# Patient Record
Sex: Female | Born: 1985 | Hispanic: No | Marital: Married | Smoking: Never smoker
Health system: Southern US, Community
[De-identification: ages and names within clinical notes are randomized; demographics above are authoritative.]

## PROBLEM LIST (undated history)

## (undated) DIAGNOSIS — Z789 Other specified health status: Secondary | ICD-10-CM

## (undated) DIAGNOSIS — O24419 Gestational diabetes mellitus in pregnancy, unspecified control: Secondary | ICD-10-CM

## (undated) HISTORY — DX: Gestational diabetes mellitus in pregnancy, unspecified control: O24.419

## (undated) HISTORY — PX: NO PAST SURGERIES: SHX2092

## (undated) HISTORY — PX: UTERINE SEPTUM RESECTION: SHX5386

---

## 2014-07-09 LAB — OB RESULTS CONSOLE GC/CHLAMYDIA
Chlamydia: NEGATIVE
Gonorrhea: NEGATIVE

## 2014-07-09 LAB — OB RESULTS CONSOLE RPR: RPR: NONREACTIVE

## 2014-07-09 LAB — OB RESULTS CONSOLE HIV ANTIBODY (ROUTINE TESTING): HIV: NONREACTIVE

## 2014-07-09 LAB — OB RESULTS CONSOLE ABO/RH: RH TYPE: POSITIVE

## 2014-07-09 LAB — OB RESULTS CONSOLE RUBELLA ANTIBODY, IGM: Rubella: IMMUNE

## 2014-07-09 LAB — OB RESULTS CONSOLE ANTIBODY SCREEN: Antibody Screen: NEGATIVE

## 2014-07-09 LAB — OB RESULTS CONSOLE HEPATITIS B SURFACE ANTIGEN: HEP B S AG: NEGATIVE

## 2014-10-06 ENCOUNTER — Other Ambulatory Visit (HOSPITAL_COMMUNITY): Payer: Self-pay | Admitting: Obstetrics and Gynecology

## 2014-10-06 DIAGNOSIS — Q210 Ventricular septal defect: Secondary | ICD-10-CM

## 2014-10-31 ENCOUNTER — Encounter (HOSPITAL_COMMUNITY): Payer: Self-pay

## 2014-10-31 ENCOUNTER — Ambulatory Visit (HOSPITAL_COMMUNITY)
Admission: RE | Admit: 2014-10-31 | Discharge: 2014-10-31 | Disposition: A | Discharge status: Home / Self Care | Payer: 59 | Source: Ambulatory Visit | Attending: Obstetrics and Gynecology | Admitting: Obstetrics and Gynecology

## 2014-10-31 ENCOUNTER — Other Ambulatory Visit (HOSPITAL_COMMUNITY): Payer: Self-pay | Admitting: Obstetrics and Gynecology

## 2014-10-31 ENCOUNTER — Encounter (HOSPITAL_COMMUNITY): Payer: Self-pay | Admitting: Obstetrics and Gynecology

## 2014-10-31 DIAGNOSIS — Z3A22 22 weeks gestation of pregnancy: Secondary | ICD-10-CM | POA: Diagnosis not present

## 2014-10-31 DIAGNOSIS — Q210 Ventricular septal defect: Secondary | ICD-10-CM

## 2014-10-31 DIAGNOSIS — O358XX0 Maternal care for other (suspected) fetal abnormality and damage, not applicable or unspecified: Secondary | ICD-10-CM | POA: Diagnosis not present

## 2014-10-31 HISTORY — DX: Other specified health status: Z78.9

## 2014-12-17 ENCOUNTER — Encounter: Payer: 59 | Attending: Obstetrics and Gynecology

## 2014-12-17 VITALS — Ht 61.0 in | Wt 142.6 lb

## 2014-12-17 DIAGNOSIS — R7302 Impaired glucose tolerance (oral): Secondary | ICD-10-CM | POA: Insufficient documentation

## 2014-12-17 DIAGNOSIS — Z713 Dietary counseling and surveillance: Secondary | ICD-10-CM | POA: Insufficient documentation

## 2014-12-17 DIAGNOSIS — R7309 Other abnormal glucose: Secondary | ICD-10-CM

## 2014-12-17 NOTE — Progress Notes (Signed)
  Patient was seen on 12/17/14 for Gestational Diabetes self-management class at the [facility]. The following learning objectives were met by the patient during this course:   States the definition of Gestational Diabetes  States why dietary management is important in controlling blood glucose  Describes the effects each nutrient has on blood glucose levels  Demonstrates ability to create a balanced meal plan  Demonstrates carbohydrate counting   States when to check blood glucose levels  Demonstrates proper blood glucose monitoring techniques  States the effect of stress and exercise on blood glucose levels  States the importance of limiting caffeine and abstaining from alcohol and smoking  Blood glucose monitor given: Accu chek [employer]iva BG Kit Lot # [id number] Exp: 07/15/15  Blood glucose reading: 131 mg/dl  Patient instructed to monitor glucose levels: FBS: 60 - <90 1 hour: <140  *Patient received handouts:  Nutrition Diabetes and Pregnancy  Carbohydrate Counting List  Patient will be seen for follow-up as needed.

## 2014-12-24 ENCOUNTER — Ambulatory Visit: Payer: 59

## 2015-01-25 ENCOUNTER — Inpatient Hospital Stay (HOSPITAL_COMMUNITY)
Admission: RE | Admit: 2015-01-25 | Discharge: 2015-01-29 | DRG: 766 | Disposition: A | Discharge status: Home / Self Care | Payer: 59 | Source: Ambulatory Visit | Attending: Obstetrics and Gynecology | Admitting: Obstetrics and Gynecology

## 2015-01-25 ENCOUNTER — Inpatient Hospital Stay (HOSPITAL_COMMUNITY): Payer: 59

## 2015-01-25 ENCOUNTER — Encounter (HOSPITAL_COMMUNITY): Payer: Self-pay | Admitting: *Deleted

## 2015-01-25 DIAGNOSIS — O34593 Maternal care for other abnormalities of gravid uterus, third trimester: Secondary | ICD-10-CM | POA: Diagnosis present

## 2015-01-25 DIAGNOSIS — Z98891 History of uterine scar from previous surgery: Secondary | ICD-10-CM

## 2015-01-25 DIAGNOSIS — O42913 Preterm premature rupture of membranes, unspecified as to length of time between rupture and onset of labor, third trimester: Principal | ICD-10-CM | POA: Diagnosis present

## 2015-01-25 DIAGNOSIS — O34211 Maternal care for low transverse scar from previous cesarean delivery: Secondary | ICD-10-CM | POA: Diagnosis present

## 2015-01-25 DIAGNOSIS — O42013 Preterm premature rupture of membranes, onset of labor within 24 hours of rupture, third trimester: Secondary | ICD-10-CM

## 2015-01-25 DIAGNOSIS — O321XX0 Maternal care for breech presentation, not applicable or unspecified: Secondary | ICD-10-CM | POA: Diagnosis present

## 2015-01-25 DIAGNOSIS — O24419 Gestational diabetes mellitus in pregnancy, unspecified control: Secondary | ICD-10-CM

## 2015-01-25 DIAGNOSIS — Q513 Bicornate uterus: Secondary | ICD-10-CM

## 2015-01-25 DIAGNOSIS — O42919 Preterm premature rupture of membranes, unspecified as to length of time between rupture and onset of labor, unspecified trimester: Secondary | ICD-10-CM

## 2015-01-25 DIAGNOSIS — Z3A35 35 weeks gestation of pregnancy: Secondary | ICD-10-CM

## 2015-01-25 DIAGNOSIS — D649 Anemia, unspecified: Secondary | ICD-10-CM | POA: Diagnosis present

## 2015-01-25 DIAGNOSIS — O9902 Anemia complicating childbirth: Secondary | ICD-10-CM | POA: Diagnosis present

## 2015-01-25 DIAGNOSIS — O24415 Gestational diabetes mellitus in pregnancy, controlled by oral hypoglycemic drugs: Secondary | ICD-10-CM | POA: Diagnosis present

## 2015-01-25 LAB — CBC
HCT: 33.8 % — ABNORMAL LOW (ref 36.0–46.0)
Hemoglobin: 11.7 g/dL — ABNORMAL LOW (ref 12.0–15.0)
MCH: 30.2 pg (ref 26.0–34.0)
MCHC: 34.6 g/dL (ref 30.0–36.0)
MCV: 87.3 fL (ref 78.0–100.0)
Platelets: 177 10*3/uL (ref 150–400)
RBC: 3.87 MIL/uL (ref 3.87–5.11)
RDW: 14.6 % (ref 11.5–15.5)
WBC: 14.7 10*3/uL — AB (ref 4.0–10.5)

## 2015-01-25 LAB — POCT FERN TEST: POCT Fern Test: POSITIVE

## 2015-01-25 MED ORDER — FAMOTIDINE IN NACL 20-0.9 MG/50ML-% IV SOLN
20.0000 mg | Freq: Once | INTRAVENOUS | Status: AC
  Administered 2015-01-26: 20 mg via INTRAVENOUS
  Filled 2015-01-25: qty 50

## 2015-01-25 MED ORDER — LACTATED RINGERS IV SOLN
INTRAVENOUS | Status: DC
  Administered 2015-01-26 (×2): via INTRAVENOUS

## 2015-01-25 MED ORDER — FENTANYL CITRATE (PF) 100 MCG/2ML IJ SOLN
INTRAMUSCULAR | Status: AC
  Filled 2015-01-25: qty 2

## 2015-01-25 MED ORDER — LACTATED RINGERS IV BOLUS (SEPSIS)
1000.0000 mL | Freq: Once | INTRAVENOUS | Status: AC
  Administered 2015-01-25: 1000 mL via INTRAVENOUS

## 2015-01-25 MED ORDER — CITRIC ACID-SODIUM CITRATE 334-500 MG/5ML PO SOLN
30.0000 mL | Freq: Once | ORAL | Status: AC
  Administered 2015-01-26: 30 mL via ORAL
  Filled 2015-01-25: qty 15

## 2015-01-25 MED ORDER — MORPHINE SULFATE (PF) 0.5 MG/ML IJ SOLN
INTRAMUSCULAR | Status: AC
  Filled 2015-01-25: qty 10

## 2015-01-25 NOTE — H&P (Signed)
Brandy Osborne is a 29 y.o. female, G1P0 at 35.1 weeks, presented to MAU for Premature Preterm rupture of membranes and Breech presentation.  Patient Active Problem List   Diagnosis Date Noted  . 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 Gestational diabetes mellitus (GDM) 01/25/2015  . Preterm premature rupture of membranes 01/25/2015    History of present pregnancy: Patient entered care at 10.2 weeks.   EDC of 02/28/2015 was established by LMP.   Anatomy scan:  22 weeks, with normal findings and an anterior placenta.   Transverse presentation. May be a small uterine septum at fundus Additional [place] evaluations:   26.2 (11/24/14) F/u US: Breech presentation, anterior, fluid is normal with vertical pocket=  6.6 cm. Profile seen. Anatomy complete. Marginal cord not seen on today's [place].  32.3 wks (12/24/14) F/u US: Vertex presentation, anterior placenta, AFI normal 20.3 cm,  Cervix normal , no previa,   EFW 1683g 63%. Placental cord insertion seen,  measures 2.8cm, from placental edge  34.3 wks  (01/20/15) F/U US : Breech presentation, anterior placenta, fluid is normal t 80th% , EFW 2485g  65Th% Significant prenatal events:   Frequent fetal position changes from transverse, to vertex to breech.  Bicornate uterus noted on [place]    Last evaluation:  01/20/15 S. Rivard, MD   Breech  158 bpm,  90/60   142 lbs 2wk ROB after growth U/S: U/S: Singleton pregnancy. Breech presentation. Anterior placenta. Fluid is normal-AFI=80th% 21.81 cm. Cervix not seen per protocol. EFW 5 lb 8 oz, 65th% Breech reviewed: patient is not a candidate for ECV due to bicornuate Superficial varicosities of legs: compression stockings and elevation discussed Pt states when she sits for long, states she gets numb from bottom down to foot. Flu/TDap UTD. JNR,CMA GDM on Glyburide 2.5 mg at dinner: all CBGs are normal.    OB History    Gravida Para Term Preterm AB TAB SAB Ectopic Multiple Living   1              Past Medical History    Diagnosis Date  . Medical history non-contributory   . GDM (gestational diabetes mellitus)    Past Surgical History  Procedure Laterality Date  . No past surgeries     Family History: family history is not on file. Social History:  reports that she has never smoked. She does not have any smokeless tobacco history on file. She reports that she does not drink alcohol or use illicit drugs.   Prenatal Transfer Tool  Maternal Diabetes: Yes:  Diabetes Type:  Insulin/Medication controlled - glyburide Genetic Screening: Normal Maternal Ultrasounds/Referrals: Normal Fetal Ultrasounds or other Referrals:  None Maternal Substance Abuse:  No Significant Maternal Medications:  None Significant Maternal Lab Results: None  TDAP 12/10/14 Flu 11/24/14  ROS:  Gross rupture at 2150, Contractions, and +FM , No VB.   No Known Allergies  Dilation: 1 Effacement (%): 60 Exam by:: Alphonzo Severance, CNM Blood pressure 123/83, pulse 82, temperature 97.9 F (36.6 C), temperature source Oral, resp. rate 17, last menstrual period 05/24/2014.  Chest clear Heart RRR without murmur Abd gravid, NT, FH 34cm on 01/20/15 Pelvic: 1/60/high Ext: wnl   FHR: Category 1, 140 bpm,  moderate variability, +Accels, no decels UCs:  Regular every 6 min   Prenatal labs: ABO, Rh: A/Positive/-- (05/25 0000) Antibody: Negative (05/25 0000) Rubella:  !Error!   Immune RPR: Nonreactive (05/25 0000)    HBsAg: Negative (05/25 0000)  HIV: Non-reactive (05/25 0000)     GBS:  unknown Sickle cell/Hgb electrophoresis:  AA% Pap:  Wnl, 08/04/14 GC:  Negative 07/09/14 Chlamydia: Negative 07/09/14 Genetic screenings:  negative Glucola:  Abnormal GTT, Abnormal 3hr GTT Other:   Hgb 11.9 at NOB, 11.6 at 28 weeks   Assessment/Plan: IUP at 35.2 Breech presentation- verified by US  PPROM at 2150 Cat 1    Plan: Admit for Emergent Primary C/s for Breech presentation per consult with Dr. Normand Sloopillard Routine [facility] orders Pain  med/epidural prn Dr. Normand Sloopillard present   Beatrix Fettersachel StallCNM, [place] 01/26/2015, 12:21 AM

## 2015-01-25 NOTE — Anesthesia Preprocedure Evaluation (Addendum)
Anesthesia Evaluation  Patient identified by MRN, date of birth, ID band Patient awake    Reviewed: Allergy & Precautions, NPO status , Patient's Chart, lab work & pertinent test results  Airway Mallampati: II  TM Distance: >3 FB Neck ROM: Full    Dental no notable dental hx.    Pulmonary neg pulmonary ROS,    Pulmonary exam normal breath sounds clear to auscultation       Cardiovascular negative cardio ROS Normal cardiovascular exam Rhythm:Regular Rate:Normal     Neuro/Psych negative neurological ROS  negative psych ROS   GI/Hepatic negative GI ROS, Neg liver ROS,   Endo/Other  diabetes, Gestational  Renal/GU negative Renal ROS  negative genitourinary   Musculoskeletal negative musculoskeletal ROS (+)   Abdominal   Peds negative pediatric ROS (+)  Hematology negative hematology ROS (+)   Anesthesia Other Findings   Reproductive/Obstetrics (+) Pregnancy                             Anesthesia Physical Anesthesia Plan  ASA: II  Anesthesia Plan: Spinal   Post-op Pain Management:    Induction:   Airway Management Planned: Natural Airway  Additional Equipment:   Intra-op Plan:   Post-operative Plan:   Informed Consent: I have reviewed the patients History and Physical, chart, labs and discussed the procedure including the risks, benefits and alternatives for the proposed anesthesia with the patient or authorized representative who has indicated his/her understanding and acceptance.   Dental advisory given  Plan Discussed with: CRNA  Anesthesia Plan Comments:         Anesthesia Quick Evaluation  

## 2015-01-26 ENCOUNTER — Encounter (HOSPITAL_COMMUNITY): Payer: Self-pay | Admitting: *Deleted

## 2015-01-26 ENCOUNTER — Encounter (HOSPITAL_COMMUNITY)
Admission: RE | Disposition: A | Discharge status: Home / Self Care | Payer: Self-pay | Source: Ambulatory Visit | Attending: Obstetrics and Gynecology

## 2015-01-26 ENCOUNTER — Inpatient Hospital Stay (HOSPITAL_COMMUNITY): Payer: 59 | Admitting: Anesthesiology

## 2015-01-26 DIAGNOSIS — Q513 Bicornate uterus: Secondary | ICD-10-CM | POA: Diagnosis not present

## 2015-01-26 DIAGNOSIS — Z3A35 35 weeks gestation of pregnancy: Secondary | ICD-10-CM | POA: Diagnosis not present

## 2015-01-26 DIAGNOSIS — O42913 Preterm premature rupture of membranes, unspecified as to length of time between rupture and onset of labor, third trimester: Secondary | ICD-10-CM | POA: Diagnosis present

## 2015-01-26 DIAGNOSIS — D649 Anemia, unspecified: Secondary | ICD-10-CM | POA: Diagnosis present

## 2015-01-26 DIAGNOSIS — O34211 Maternal care for low transverse scar from previous cesarean delivery: Secondary | ICD-10-CM | POA: Diagnosis present

## 2015-01-26 DIAGNOSIS — O9902 Anemia complicating childbirth: Secondary | ICD-10-CM | POA: Diagnosis present

## 2015-01-26 DIAGNOSIS — Z98891 History of uterine scar from previous surgery: Secondary | ICD-10-CM

## 2015-01-26 DIAGNOSIS — O34593 Maternal care for other abnormalities of gravid uterus, third trimester: Secondary | ICD-10-CM | POA: Diagnosis present

## 2015-01-26 DIAGNOSIS — O321XX0 Maternal care for breech presentation, not applicable or unspecified: Secondary | ICD-10-CM | POA: Diagnosis present

## 2015-01-26 DIAGNOSIS — O24415 Gestational diabetes mellitus in pregnancy, controlled by oral hypoglycemic drugs: Secondary | ICD-10-CM | POA: Diagnosis present

## 2015-01-26 LAB — CBC
HEMATOCRIT: 32.5 % — AB (ref 36.0–46.0)
Hemoglobin: 11.2 g/dL — ABNORMAL LOW (ref 12.0–15.0)
MCH: 30.1 pg (ref 26.0–34.0)
MCHC: 34.5 g/dL (ref 30.0–36.0)
MCV: 87.4 fL (ref 78.0–100.0)
Platelets: 176 10*3/uL (ref 150–400)
RBC: 3.72 MIL/uL — AB (ref 3.87–5.11)
RDW: 14.6 % (ref 11.5–15.5)
WBC: 20.8 10*3/uL — AB (ref 4.0–10.5)

## 2015-01-26 LAB — GLUCOSE, CAPILLARY
Glucose-Capillary: 91 mg/dL (ref 65–99)
Glucose-Capillary: 142 mg/dL — ABNORMAL HIGH (ref 65–99)
Glucose-Capillary: 81 mg/dL (ref 65–99)
Glucose-Capillary: 88 mg/dL (ref 65–99)

## 2015-01-26 LAB — ABO/RH: ABO/RH(D): A POS

## 2015-01-26 LAB — TYPE AND SCREEN
ABO/RH(D): A POS
ANTIBODY SCREEN: NEGATIVE

## 2015-01-26 LAB — RPR: RPR: NONREACTIVE

## 2015-01-26 SURGERY — Surgical Case
Anesthesia: Spinal

## 2015-01-26 MED ORDER — MEPERIDINE HCL 25 MG/ML IJ SOLN
INTRAMUSCULAR | Status: AC
  Filled 2015-01-26: qty 1

## 2015-01-26 MED ORDER — DEXAMETHASONE SODIUM PHOSPHATE 10 MG/ML IJ SOLN
INTRAMUSCULAR | Status: AC
  Filled 2015-01-26: qty 1

## 2015-01-26 MED ORDER — DIBUCAINE 1 % RE OINT: 1.0000 "application " | TOPICAL_OINTMENT | RECTAL | Status: DC | PRN

## 2015-01-26 MED ORDER — ACETAMINOPHEN 325 MG PO TABS
650.0000 mg | ORAL_TABLET | ORAL | Status: DC | PRN
  Administered 2015-01-26 – 2015-01-27 (×2): 650 mg via ORAL
  Filled 2015-01-26 (×2): qty 2

## 2015-01-26 MED ORDER — DIPHENHYDRAMINE HCL 25 MG PO CAPS
25.0000 mg | ORAL_CAPSULE | ORAL | Status: DC | PRN
  Filled 2015-01-26: qty 1

## 2015-01-26 MED ORDER — PHENYLEPHRINE 8 MG IN D5W 100 ML (0.08MG/ML) PREMIX OPTIME
INJECTION | INTRAVENOUS | Status: AC
  Filled 2015-01-26: qty 100

## 2015-01-26 MED ORDER — SODIUM CHLORIDE 0.9 % IJ SOLN: 3.0000 mL | INTRAMUSCULAR | Status: DC | PRN

## 2015-01-26 MED ORDER — SIMETHICONE 80 MG PO CHEW
80.0000 mg | CHEWABLE_TABLET | ORAL | Status: DC | PRN
Start: 2015-01-26 — End: 2015-01-29
  Filled 2015-01-26: qty 1

## 2015-01-26 MED ORDER — FENTANYL CITRATE (PF) 100 MCG/2ML IJ SOLN: 25.0000 ug | INTRAMUSCULAR | Status: DC | PRN

## 2015-01-26 MED ORDER — LANOLIN HYDROUS EX OINT: 1.0000 "application " | TOPICAL_OINTMENT | CUTANEOUS | Status: DC | PRN

## 2015-01-26 MED ORDER — ONDANSETRON HCL 4 MG/2ML IJ SOLN: 4.0000 mg | Freq: Three times a day (TID) | INTRAMUSCULAR | Status: DC | PRN

## 2015-01-26 MED ORDER — OXYTOCIN 10 UNIT/ML IJ SOLN
INTRAMUSCULAR | Status: AC
  Filled 2015-01-26: qty 4

## 2015-01-26 MED ORDER — MENTHOL 3 MG MT LOZG: 1.0000 | LOZENGE | OROMUCOSAL | Status: DC | PRN

## 2015-01-26 MED ORDER — ONDANSETRON HCL 4 MG/2ML IJ SOLN
INTRAMUSCULAR | Status: DC | PRN
  Administered 2015-01-26: 4 mg via INTRAVENOUS

## 2015-01-26 MED ORDER — WITCH HAZEL-GLYCERIN EX PADS: 1.0000 "application " | MEDICATED_PAD | CUTANEOUS | Status: DC | PRN

## 2015-01-26 MED ORDER — SCOPOLAMINE 1 MG/3DAYS TD PT72
1.0000 | MEDICATED_PATCH | Freq: Once | TRANSDERMAL | Status: DC
  Filled 2015-01-26: qty 1

## 2015-01-26 MED ORDER — MEPERIDINE HCL 25 MG/ML IJ SOLN
INTRAMUSCULAR | Status: DC | PRN
  Administered 2015-01-26 (×2): 12.5 mg via INTRAVENOUS

## 2015-01-26 MED ORDER — OXYCODONE-ACETAMINOPHEN 5-325 MG PO TABS
2.0000 | ORAL_TABLET | ORAL | Status: DC | PRN
Start: 2015-01-26 — End: 2015-01-29

## 2015-01-26 MED ORDER — CEFAZOLIN SODIUM-DEXTROSE 2-3 GM-% IV SOLR
INTRAVENOUS | Status: AC
  Filled 2015-01-26: qty 50

## 2015-01-26 MED ORDER — LACTATED RINGERS IV SOLN: INTRAVENOUS | Status: DC

## 2015-01-26 MED ORDER — LACTATED RINGERS IV BOLUS (SEPSIS): 500.0000 mL | Freq: Once | INTRAVENOUS | Status: DC

## 2015-01-26 MED ORDER — BISACODYL 10 MG RE SUPP: 10.0000 mg | Freq: Every day | RECTAL | Status: DC | PRN

## 2015-01-26 MED ORDER — ONDANSETRON HCL 4 MG/2ML IJ SOLN
INTRAMUSCULAR | Status: AC
  Filled 2015-01-26: qty 2

## 2015-01-26 MED ORDER — OXYTOCIN 40 UNITS IN LACTATED RINGERS INFUSION - SIMPLE MED: 62.5000 mL/h | INTRAVENOUS | Status: AC

## 2015-01-26 MED ORDER — DIPHENHYDRAMINE HCL 50 MG/ML IJ SOLN: 12.5000 mg | INTRAMUSCULAR | Status: DC | PRN

## 2015-01-26 MED ORDER — NALBUPHINE HCL 10 MG/ML IJ SOLN: 5.0000 mg | Freq: Once | INTRAMUSCULAR | Status: DC | PRN

## 2015-01-26 MED ORDER — MEPERIDINE HCL 25 MG/ML IJ SOLN: 6.2500 mg | INTRAMUSCULAR | Status: DC | PRN

## 2015-01-26 MED ORDER — FENTANYL CITRATE (PF) 100 MCG/2ML IJ SOLN
INTRAMUSCULAR | Status: AC
  Filled 2015-01-26: qty 2

## 2015-01-26 MED ORDER — SENNOSIDES-DOCUSATE SODIUM 8.6-50 MG PO TABS
2.0000 | ORAL_TABLET | ORAL | Status: DC
  Administered 2015-01-26 – 2015-01-28 (×3): 2 via ORAL
  Filled 2015-01-26 (×3): qty 2

## 2015-01-26 MED ORDER — NALBUPHINE HCL 10 MG/ML IJ SOLN: 5.0000 mg | INTRAMUSCULAR | Status: DC | PRN

## 2015-01-26 MED ORDER — IBUPROFEN 600 MG PO TABS
600.0000 mg | ORAL_TABLET | Freq: Four times a day (QID) | ORAL | Status: DC
  Administered 2015-01-26 – 2015-01-29 (×13): 600 mg via ORAL
  Filled 2015-01-26 (×14): qty 1

## 2015-01-26 MED ORDER — PHENYLEPHRINE 40 MCG/ML (10ML) SYRINGE FOR IV PUSH (FOR BLOOD PRESSURE SUPPORT)
PREFILLED_SYRINGE | INTRAVENOUS | Status: AC
  Filled 2015-01-26: qty 10

## 2015-01-26 MED ORDER — DIPHENHYDRAMINE HCL 25 MG PO CAPS: 25.0000 mg | ORAL_CAPSULE | Freq: Four times a day (QID) | ORAL | Status: DC | PRN

## 2015-01-26 MED ORDER — MEASLES, MUMPS & RUBELLA VAC ~~LOC~~ INJ
0.5000 mL | INJECTION | Freq: Once | SUBCUTANEOUS | Status: DC
  Filled 2015-01-26: qty 0.5

## 2015-01-26 MED ORDER — BUPIVACAINE IN DEXTROSE 0.75-8.25 % IT SOLN
INTRATHECAL | Status: DC | PRN
  Administered 2015-01-26: 1.4 mL via INTRATHECAL

## 2015-01-26 MED ORDER — MORPHINE SULFATE (PF) 0.5 MG/ML IJ SOLN
INTRAMUSCULAR | Status: AC
  Filled 2015-01-26: qty 10

## 2015-01-26 MED ORDER — KETOROLAC TROMETHAMINE 30 MG/ML IJ SOLN: 30.0000 mg | Freq: Four times a day (QID) | INTRAMUSCULAR | Status: DC | PRN

## 2015-01-26 MED ORDER — NALBUPHINE HCL 10 MG/ML IJ SOLN
INTRAMUSCULAR | Status: DC
Start: 2015-01-26 — End: 2015-01-26
  Filled 2015-01-26: qty 1

## 2015-01-26 MED ORDER — PRENATAL MULTIVITAMIN CH
1.0000 | ORAL_TABLET | Freq: Every day | ORAL | Status: DC
  Administered 2015-01-26 – 2015-01-29 (×4): 1 via ORAL
  Filled 2015-01-26 (×5): qty 1

## 2015-01-26 MED ORDER — OXYCODONE-ACETAMINOPHEN 5-325 MG PO TABS
1.0000 | ORAL_TABLET | ORAL | Status: DC | PRN
Start: 2015-01-26 — End: 2015-01-29
  Administered 2015-01-27 – 2015-01-28 (×2): 1 via ORAL
  Filled 2015-01-26 (×2): qty 1

## 2015-01-26 MED ORDER — FLEET ENEMA 7-19 GM/118ML RE ENEM: 1.0000 | ENEMA | Freq: Every day | RECTAL | Status: DC | PRN

## 2015-01-26 MED ORDER — FERROUS SULFATE 325 (65 FE) MG PO TABS
325.0000 mg | ORAL_TABLET | Freq: Two times a day (BID) | ORAL | Status: DC
  Administered 2015-01-26 – 2015-01-29 (×7): 325 mg via ORAL
  Filled 2015-01-26 (×7): qty 1

## 2015-01-26 MED ORDER — SODIUM CHLORIDE 0.9 % IR SOLN
Status: DC | PRN
  Administered 2015-01-26: 1

## 2015-01-26 MED ORDER — LACTATED RINGERS IV SOLN
INTRAVENOUS | Status: DC
  Administered 2015-01-26: 10:00:00 via INTRAVENOUS

## 2015-01-26 MED ORDER — EPHEDRINE 5 MG/ML INJ
INTRAVENOUS | Status: AC
  Filled 2015-01-26: qty 10

## 2015-01-26 MED ORDER — ZOLPIDEM TARTRATE 5 MG PO TABS: 5.0000 mg | ORAL_TABLET | Freq: Every evening | ORAL | Status: DC | PRN

## 2015-01-26 MED ORDER — DEXAMETHASONE SODIUM PHOSPHATE 4 MG/ML IJ SOLN
INTRAMUSCULAR | Status: DC | PRN
Start: 2015-01-26 — End: 2015-01-26
  Administered 2015-01-26: 10 mg via INTRAVENOUS

## 2015-01-26 MED ORDER — PHENYLEPHRINE HCL 10 MG/ML IJ SOLN
INTRAMUSCULAR | Status: DC | PRN
  Administered 2015-01-26: 40 ug via INTRAVENOUS
  Administered 2015-01-26: 80 ug via INTRAVENOUS

## 2015-01-26 MED ORDER — TETANUS-DIPHTH-ACELL PERTUSSIS 5-2.5-18.5 LF-MCG/0.5 IM SUSP: 0.5000 mL | Freq: Once | INTRAMUSCULAR | Status: DC

## 2015-01-26 MED ORDER — PHENYLEPHRINE 8 MG IN D5W 100 ML (0.08MG/ML) PREMIX OPTIME
INJECTION | INTRAVENOUS | Status: DC | PRN
  Administered 2015-01-26: 40 ug/min via INTRAVENOUS

## 2015-01-26 MED ORDER — EPHEDRINE SULFATE 50 MG/ML IJ SOLN
INTRAMUSCULAR | Status: DC | PRN
  Administered 2015-01-26: 5 mg via INTRAVENOUS

## 2015-01-26 MED ORDER — LACTATED RINGERS IV SOLN
40.0000 [IU] | INTRAVENOUS | Status: DC | PRN
  Administered 2015-01-26: 40 [IU] via INTRAVENOUS

## 2015-01-26 MED ORDER — NALOXONE HCL 0.4 MG/ML IJ SOLN
0.4000 mg | INTRAMUSCULAR | Status: DC | PRN
Start: 2015-01-26 — End: 2015-01-29

## 2015-01-26 MED ORDER — OXYTOCIN 10 UNIT/ML IJ SOLN
INTRAMUSCULAR | Status: AC
Start: 2015-01-26 — End: 2015-01-26
  Filled 2015-01-26: qty 3

## 2015-01-26 MED ORDER — KETOROLAC TROMETHAMINE 30 MG/ML IJ SOLN
INTRAMUSCULAR | Status: AC
  Filled 2015-01-26: qty 1

## 2015-01-26 MED ORDER — SIMETHICONE 80 MG PO CHEW
80.0000 mg | CHEWABLE_TABLET | Freq: Three times a day (TID) | ORAL | Status: DC
  Administered 2015-01-26 – 2015-01-29 (×10): 80 mg via ORAL
  Filled 2015-01-26 (×10): qty 1

## 2015-01-26 MED ORDER — CEFAZOLIN SODIUM-DEXTROSE 2-3 GM-% IV SOLR
INTRAVENOUS | Status: DC | PRN
  Administered 2015-01-26: 2 g via INTRAVENOUS

## 2015-01-26 MED ORDER — MORPHINE SULFATE (PF) 0.5 MG/ML IJ SOLN
INTRAMUSCULAR | Status: DC | PRN
  Administered 2015-01-26: .1 mg via INTRATHECAL

## 2015-01-26 MED ORDER — SIMETHICONE 80 MG PO CHEW
80.0000 mg | CHEWABLE_TABLET | ORAL | Status: DC
  Administered 2015-01-26 – 2015-01-28 (×3): 80 mg via ORAL
  Filled 2015-01-26 (×3): qty 1

## 2015-01-26 MED ORDER — FENTANYL CITRATE (PF) 100 MCG/2ML IJ SOLN
INTRAMUSCULAR | Status: DC | PRN
  Administered 2015-01-26: 12.5 ug via INTRATHECAL

## 2015-01-26 MED ORDER — KETOROLAC TROMETHAMINE 30 MG/ML IJ SOLN
30.0000 mg | Freq: Four times a day (QID) | INTRAMUSCULAR | Status: DC | PRN
  Administered 2015-01-26: 30 mg via INTRAMUSCULAR

## 2015-01-26 MED ORDER — NALOXONE HCL 2 MG/2ML IJ SOSY
1.0000 ug/kg/h | PREFILLED_SYRINGE | INTRAVENOUS | Status: DC | PRN
  Filled 2015-01-26: qty 2

## 2015-01-26 SURGICAL SUPPLY — 37 items
BENZOIN TINCTURE PRP APPL 2/3 (GAUZE/BANDAGES/DRESSINGS) ×3 IMPLANT
CLAMP CORD UMBIL (MISCELLANEOUS) IMPLANT
CLOSURE STERI STRIP 1/2 X4 (GAUZE/BANDAGES/DRESSINGS) ×3 IMPLANT
CLOTH BEACON ORANGE TIMEOUT ST (SAFETY) ×3 IMPLANT
CONTAINER PREFILL 10% NBF 15ML (MISCELLANEOUS) IMPLANT
DRAIN JACKSON PRT FLT 10 (DRAIN) IMPLANT
DRAPE SHEET LG 3/4 BI-LAMINATE (DRAPES) IMPLANT
DRSG OPSITE POSTOP 4X10 (GAUZE/BANDAGES/DRESSINGS) ×3 IMPLANT
DURAPREP 26ML APPLICATOR (WOUND CARE) ×3 IMPLANT
ELECT REM PT RETURN 9FT ADLT (ELECTROSURGICAL) ×3
ELECTRODE REM PT RTRN 9FT ADLT (ELECTROSURGICAL) ×1 IMPLANT
EVACUATOR SILICONE 100CC (DRAIN) IMPLANT
EXTRACTOR VACUUM M CUP 4 TUBE (SUCTIONS) IMPLANT
EXTRACTOR VACUUM M CUP 4' TUBE (SUCTIONS)
GLOVE BIO SURGEON STRL SZ 6.5 (GLOVE) ×2 IMPLANT
GLOVE BIO SURGEONS STRL SZ 6.5 (GLOVE) ×1
GLOVE BIOGEL PI IND STRL 7.0 (GLOVE) ×2 IMPLANT
GLOVE BIOGEL PI INDICATOR 7.0 (GLOVE) ×4
GOWN STRL REUS W/TWL LRG LVL3 (GOWN DISPOSABLE) ×6 IMPLANT
KIT ABG SYR 3ML LUER SLIP (SYRINGE) IMPLANT
NEEDLE HYPO 25X5/8 SAFETYGLIDE (NEEDLE) IMPLANT
NS IRRIG 1000ML POUR BTL (IV SOLUTION) ×3 IMPLANT
PACK C SECTION WH (CUSTOM PROCEDURE TRAY) ×3 IMPLANT
PAD OB MATERNITY 4.3X12.25 (PERSONAL CARE ITEMS) ×3 IMPLANT
PENCIL SMOKE EVAC W/HOLSTER (ELECTROSURGICAL) ×3 IMPLANT
RTRCTR C-SECT PINK 25CM LRG (MISCELLANEOUS) IMPLANT
STRIP CLOSURE SKIN 1/2X4 (GAUZE/BANDAGES/DRESSINGS) ×2 IMPLANT
SUT CHROMIC 0 CT 1 (SUTURE) ×3 IMPLANT
SUT MNCRL AB 3-0 PS2 27 (SUTURE) ×3 IMPLANT
SUT PLAIN 2 0 (SUTURE) ×4
SUT PLAIN 2 0 XLH (SUTURE) ×3 IMPLANT
SUT PLAIN ABS 2-0 CT1 27XMFL (SUTURE) ×2 IMPLANT
SUT SILK 2 0 SH (SUTURE) IMPLANT
SUT VIC AB 0 CTX 36 (SUTURE) ×10
SUT VIC AB 0 CTX36XBRD ANBCTRL (SUTURE) ×5 IMPLANT
TOWEL OR 17X24 6PK STRL BLUE (TOWEL DISPOSABLE) ×3 IMPLANT
TRAY FOLEY CATH SILVER 14FR (SET/KITS/TRAYS/PACK) ×3 IMPLANT

## 2015-01-26 NOTE — Op Note (Addendum)
Cesarean Section Procedure Note   Brandy MartesSangita Vidaurri  01/25/2015 - 01/26/2015  Indications: Breech Presentation and PPROM GDM   Pre-operative Diagnosis: Breech with rupture of membrane.   Post-operative Diagnosis: Same   Surgeon Devlin Brink  Assistants: Claudie Revering. STHALL CNM  Anesthesia: spinal   Procedure Details:  The patient was seen in the Holding Room. The risks, benefits, complications, treatment options, and expected outcomes were discussed with the patient. The patient concurred with the proposed plan, giving informed consent. identified as Brandy Osborne and the procedure verified as C-Section Delivery. A Time Out was held and the above information confirmed.  After induction of anesthesia, the patient was draped and prepped in the usual sterile manner. A transverse incision was made and carried down through the subcutaneous tissue to the fascia. Fascial incision was made and extended transversely. The fascia was separated from the underlying rectus tissue superiorly and inferiorly. The peritoneum was identified and entered. Peritoneal incision was extended longitudinally. The utero-vesical peritoneal reflection was incised transversely and the bladder flap was bluntly freed from the lower uterine segment. A low transverse uterine incision was made. Delivered from breech frank presentation was a  pound Living newborn infant(s) or Female with Apgar scores of 8 at one minute and 9 at five minutes. Cord ph was not sent the umbilical cord was clamped and cut cord blood was obtained for evaluation. The placenta was removed Intact and appeared normal. The uterine outline, tubes and ovaries appeared however the uteurs had a heart shape appearance c/w bicornuate uterus}. The uterine incision was closed with running locked sutures of 0Vicryl. A second layer of 0 vicryl was used to imbricate the uterus.   Lavage was carried out until clear. The fascia was then reapproximated with running sutures of 0Vicryl.  The subcuticular closure was performed using 2-0plain gut. The skin was closed with 4-320monocryl.   Instrument, sponge, and needle counts were correct prior the abdominal closure and were correct at the conclusion of the case.    Findings:   Estimated Blood Loss: 600ml  Total IV Fluids: 1500ml   Urine Output: 300CC OF clear urine  Specimens: placenta to pathology  Complications: no complications  Disposition: PACU - hemodynamically stable.   Maternal Condition: stable   Baby condition / location:  Couplet care / Skin to Skin  Attending Attestation: I performed the procedure.   Signed: Surgeon(s): Jaymes GraffNaima Kiyoto Slomski, MD

## 2015-01-26 NOTE — Lactation Note (Signed)
This note was copied from the chart of Brandy Cristino MartesSangita Haubner. Lactation Consultation Note New mom had C-section w/baby's blood sugars low. Ended up giving formula d/t baby becoming floppy per nursery RN. Took syring to [employer] fund[employer]. Spoke w/mom and FOB. Staff fixing to transfer mom, will let get settled and will f/u.  Patient Name: Brandy Osborne ZOXWR'UToday's Date: 01/26/2015 Reason for consult: Initial assessment   Maternal Data Has patient been taught Hand Expression?: Yes Does the patient have breastfeeding experience prior to this delivery?: No  Feeding Feeding Type: Breast Fed  LATCH Score/Interventions Latch: Repeated attempts needed to sustain latch, nipple held in mouth throughout feeding, stimulation needed to elicit sucking reflex. Intervention(s): Adjust position;Assist with latch;Breast compression  Audible Swallowing: A few with stimulation Intervention(s): Skin to skin;Hand expression  Type of Nipple: Everted at rest and after stimulation  Comfort (Breast/Nipple): Soft / non-tender     Hold (Positioning): Full assist, staff holds infant at breast  LATCH Score: 6  Lactation Tools Discussed/Used     Consult Status Consult Status: Follow-up Date: 01/26/15 Follow-up type: In-patient    Charyl DancerCARVER, Sharesa Kemp G 01/26/2015, 6:35 AM

## 2015-01-26 NOTE — Progress Notes (Signed)
Subjective: Postpartum Day 0: Cesarean Delivery due to SROM and breech Patient up ad lib, reports no syncope or dizziness. Feeding:  breast Contraceptive plan:  No discussed  Objective: Vital signs in last 24 hours: Temp:  [97.7 F (36.5 C)-99 F (37.2 C)] 98.4 F (36.9 C) (12/12 0645) Pulse Rate:  [73-99] 73 (12/12 0645) Resp:  [17-21] 20 (12/12 0645) BP: (85-123)/(50-98) 102/50 mmHg (12/12 0645) SpO2:  [97 %-100 %] 98 % (12/12 0645) Weight:  [143 lb (64.864 kg)] 143 lb (64.864 kg) (12/12 0151)  Physical Exam:  General: alert and cooperative Lochia: appropriate Uterine Fundus: firm Abdomen:  + bowel sounds, non distended Incision: no significant drainage  Honeycomb dressing CDI DVT Evaluation: No evidence of DVT seen on physical exam. Homan's sign: Negative   Recent Labs  01/25/15 2325 01/26/15 0610  HGB 11.7* 11.2*  HCT 33.8* 32.5*  WBC 14.7* 20.8*    Assessment: Status post Cesarean section day 0. Doing well postoperatively.  Honeycomb dressing in place, no significant drainage Anemia - hemodynamicly stable.    Plan: Continue current care. Lactation consult Remove outer dressing   Ger Nicks, CNM, MSN 01/26/2015. 9:00 AM

## 2015-01-26 NOTE — Anesthesia Procedure Notes (Signed)
Spinal Patient location during procedure: OR Staffing Anesthesiologist: Montez Hageman Performed by: anesthesiologist  Preanesthetic Checklist Completed: patient identified, site marked, surgical consent, pre-op evaluation, timeout performed, IV checked, risks and benefits discussed and monitors and equipment checked Spinal Block Patient position: sitting Prep: DuraPrep Patient monitoring: heart rate, continuous pulse ox and blood pressure Approach: midline Location: L3-4 Injection technique: single-shot Needle Needle type: Sprotte  Needle gauge: 24 G Needle length: 9 cm Additional Notes Expiration date of kit checked and confirmed. Patient tolerated procedure well, without complications.

## 2015-01-26 NOTE — Transfer of Care (Signed)
Immediate Anesthesia Transfer of Care Note  Patient: Cristino MartesSangita Pavlov  Procedure(s) Performed: Procedure(s): CESAREAN SECTION (N/A)  Patient Location: PACU  Anesthesia Type:Spinal  Level of Consciousness: awake, alert  and oriented  Airway & Oxygen Therapy: Patient Spontanous Breathing  Post-op Assessment: Report given to RN and Post -op Vital signs reviewed and stable  Post vital signs: Reviewed and stable  Last Vitals:  Filed Vitals:   01/26/15 0009 01/26/15 0151  BP: 118/74 121/76  Pulse: 82 77  Temp:  36.7 C  Resp: 17 18    Complications: No apparent anesthesia complications

## 2015-01-26 NOTE — Consult Note (Signed)
Neonatology Note:   Attendance at C-section:    I was asked by Dr. Dillard to attend this urgent C/S at 35 2/[redacted] weeks EGA due to PROM with breech presentation. The mother is a G1, GBS unkl with otherwise reassuring labs. Maternal hx complicated by bicornate uterus and GDM on glyburide.  ROM 4h prior to delivery, fluid clear. Infant vigorous with good spontaneous cry and tone. HR >100.  60 sec delayed cord clamping. Needed only minimal bulb suctioning. Ap 8 and 9. Lungs clear to ausc in DR. To CN to care of Pediatrician.  Support lactation.   David Ehrmann, MD 

## 2015-01-26 NOTE — Lactation Note (Signed)
This note was copied from the chart of Brandy Cristino MartesSangita Montelongo. Lactation Consultation Note New mom had c/s of 35 2/7 weeks. 5'14 lb baby Brandy whom had low blood sugars after birth. Mom has everted nipples w/great colostrum flow. Hand expression taught to Mom. WH/LC brochure given w/resources, support groups and LC services.Mom encouraged to do skin-to-skin. Hand expressed Lt. Breast of 5ml colostrum and gave to baby w/curve tip syring and gloved finger. Mom laying flat in bed very tired. I demonstrated DEBP connecting and cleaning. Encouraged mom to pump every three hours after BF no longer than 20 min. Gave mom LPI information sheet.mom speaks good [place] and receptive to teaching. 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 FOB supportive at bedside. Baby wrapped in 2 blankets snug and acts slightly jittery and raising legs up at intervals in blanket. Lab into draw blood on baby to check POTC serum. Instructed mom not to BF and supplement baby longer than 30 min. To protect energy of baby. Mom understands. Reviewed LPI newborn behavior and the importance of I&O, supply and demand.  Mom encouraged to feed baby 8-12 times/24 hours and with feeding cues. Mom encouraged to waken baby for feeds. Peds. Dr. Len Blalockame in while teaching and lab. Mom stated she was very tired.  Patient Name: Brandy Cristino MartesSangita Dusseau WUXLK'GToday's Date: 01/26/2015 Reason for consult: Initial assessment   Maternal Data Has patient been taught Hand Expression?: Yes Does the patient have breastfeeding experience prior to this delivery?: No  Feeding Feeding Type: Breast Milk Nipple Type: Slow - flow Length of feed: 5 min  LATCH Score/Interventions       Type of Nipple: Everted at rest and after stimulation  Comfort (Breast/Nipple): Soft / non-tender     Intervention(s): Skin to skin;Position options;Support Pillows;Breastfeeding basics reviewed     Lactation Tools Discussed/Used Tools: Pump Breast  pump type: Double-Electric Breast Pump Pump Review: Setup, frequency, and cleaning;Milk Storage Initiated by:: Peri JeffersonL. Deysha Cartier RN Date initiated:: 01/26/15   Consult Status Consult Status: Follow-up Date: 01/26/15 Follow-up type: In-patient    Jerren Flinchbaugh, Diamond NickelLAURA G 01/26/2015, 8:07 AM

## 2015-01-26 NOTE — Addendum Note (Signed)
Addendum  created 01/26/15 [id number] by Algis GreenhouseLinda A Wilder Kurowski, CRNA   Modules edited: Clinical Notes   Clinical Notes:  File: [id number]

## 2015-01-26 NOTE — Anesthesia Postprocedure Evaluation (Signed)
Anesthesia Post Note  Patient: Brandy MartesSangita Osborne  Procedure(s) Performed: Procedure(s) (LRB): CESAREAN SECTION (N/A)  Patient location during evaluation: Mother Baby Anesthesia Type: Spinal Level of consciousness: awake Pain management: satisfactory to patient Vital Signs Assessment: post-procedure vital signs reviewed and stable Respiratory status: spontaneous breathing Cardiovascular status: stable Anesthetic complications: no    Last Vitals:  Filed Vitals:   01/26/15 0545 01/26/15 0645  BP: 105/55 102/50  Pulse: 74 73  Temp: 37 C 36.9 C  Resp: 20 20    Last Pain:  Filed Vitals:   01/26/15 0703  PainSc: 0-No pain                 Munirah Doerner

## 2015-01-26 NOTE — Lactation Note (Signed)
This note was copied from the chart of Brandy Cristino MartesSangita Rieman. Lactation Consultation Note Follow up at 15 hours of age.  [facility] RN reports baby sleepy.  [facility].  Baby already undressed.  LC assisted with STS and latching.  Baby latched well with assist.  Mom is recovering from c/s and not assertive with feedings.  Basic education done and encouraged mom to allow nose chin and cheeks to touch breast to allow for a deeper latch, and mom reports improved comfort with deep latch.  LC hand expressed a few mls during feeding.  Mom to post pump and offer EBM to baby with syring feeding.  Discussed need to supplement every feeding with EBM or formula as needed and mom is aware to limit feeding times to 30minutes.    MBU RN aware of plan.    Patient Name: Brandy Osborne ZOXWR'UToday's Date: 01/26/2015 Reason for consult: Follow-up assessment   Maternal Data    Feeding Feeding Type: Breast Fed Length of feed:  (observed 10 minutes)  LATCH Score/Interventions Latch: Grasps breast easily, tongue down, lips flanged, rhythmical sucking. Intervention(s): Adjust position;Assist with latch;Breast massage;Breast compression  Audible Swallowing: A few with stimulation Intervention(s): Skin to skin;Hand expression;Alternate breast massage  Type of Nipple: Everted at rest and after stimulation  Comfort (Breast/Nipple): Soft / non-tender     Hold (Positioning): Assistance needed to correctly position infant at breast and maintain latch. Intervention(s): Breastfeeding basics reviewed;Support Pillows;Position options;Skin to skin  LATCH Score: 8  Lactation Tools Discussed/Used     Consult Status Consult Status: Follow-up Date: 01/27/15 Follow-up type: In-patient    Jannifer RodneyShoptaw, Kazi Reppond Lynn 01/26/2015, 6:28 PM

## 2015-01-26 NOTE — Anesthesia Postprocedure Evaluation (Signed)
Anesthesia Post Note  Patient: Cristino MartesSangita Authier  Procedure(s) Performed: Procedure(s) (LRB): CESAREAN SECTION (N/A)  Patient location during evaluation: PACU Anesthesia Type: Spinal Level of consciousness: oriented and awake and alert Pain management: pain level controlled Vital Signs Assessment: post-procedure vital signs reviewed and stable Respiratory status: spontaneous breathing, respiratory function stable and patient connected to nasal cannula oxygen Cardiovascular status: blood pressure returned to baseline and stable Postop Assessment: no headache and no backache Anesthetic complications: no    Last Vitals:  Filed Vitals:   01/26/15 0430 01/26/15 0446  BP: 102/67 105/56  Pulse: 86 85  Temp: 36.9 C 37.2 C  Resp: 19 18    Last Pain:  Filed Vitals:   01/26/15 0455  PainSc: 3                  Phillips Groutarignan, Henriette Hesser

## 2015-01-27 ENCOUNTER — Encounter (HOSPITAL_COMMUNITY): Payer: Self-pay | Admitting: Obstetrics and Gynecology

## 2015-01-27 LAB — CBC
HCT: 29.6 % — ABNORMAL LOW (ref 36.0–46.0)
Hemoglobin: 9.8 g/dL — ABNORMAL LOW (ref 12.0–15.0)
MCH: 29.6 pg (ref 26.0–34.0)
MCHC: 33.1 g/dL (ref 30.0–36.0)
MCV: 89.4 fL (ref 78.0–100.0)
PLATELETS: 146 10*3/uL — AB (ref 150–400)
RBC: 3.31 MIL/uL — ABNORMAL LOW (ref 3.87–5.11)
RDW: 15 % (ref 11.5–15.5)
WBC: 14.1 10*3/uL — ABNORMAL HIGH (ref 4.0–10.5)

## 2015-01-27 LAB — GLUCOSE, CAPILLARY
Glucose-Capillary: 79 mg/dL (ref 65–99)
Glucose-Capillary: 101 mg/dL — ABNORMAL HIGH (ref 65–99)

## 2015-01-27 NOTE — Progress Notes (Signed)
Brandy MartesSangita Osborne [id number]  Subjective: Postpartum Day 1: Primary LTC/S due to breech, SROM Patient up in limited amounts, no syncope or dizziness.  Able to ambulate to BR for voiding, but does report pain with ambulation. Feeding:  Breast Contraceptive plan:  Undecided  Taking Tylenol and Motrin--has declined Percocet.  Multiple questions reviewed regarding mother and baby status.  Objective: Temp:  [98 F (36.7 C)-99.2 F (37.3 C)] 98 F (36.7 C) (12/13 0556) Pulse Rate:  [67-79] 68 (12/13 0556) Resp:  [18-20] 18 (12/13 0556) BP: (83-98)/(47-76) 98/76 mmHg (12/13 0556) SpO2:  [96 %-99 %] 99 % (12/13 0200)  CBC Latest Ref Rng 01/27/2015 01/26/2015 01/25/2015  WBC 4.0 - 10.5 K/uL 14.1(H) 20.8(H) 14.7(H)  Hemoglobin 12.0 - 15.0 g/dL 1.3(Y9.8(L) 11.2(L) 11.7(L)  Hematocrit 36.0 - 46.0 % 29.6(L) 32.5(L) 33.8(L)  Platelets 150 - 400 K/uL 146(L) 176 177     Physical Exam:  General: alert Lochia: appropriate Uterine Fundus: firm Abdomen:  + bowel sounds, mild gaseous distension Incision: Honeycomb dressing CI, old drainage marked. DVT Evaluation: No evidence of DVT seen on physical exam. Negative Homan's sign.   Assessment/Plan: Status post cesarean delivery, day 1 Leukocytosis without fever on day 0 CBC Stable Continue current care. Encourage ambulation Encourage appropriate use of pain med to facilitate ambulation.   Brandy BridgemanLATHAM, Brandy Shelvin MSN, CNM 01/27/2015, 9:38 AM

## 2015-01-27 NOTE — Lactation Note (Signed)
This note was copied from the chart of Brandy Osborne Jaskiewicz. Lactation Consultation Note; Baby asleep in bassinet and mom eating breakfast  Reports last feeding about 1 hour ago. Reports baby has had 2 good feedings this morning for 25 min each. Mom has not pumped today. Baby fed formula through the night. Encouraged to rest when baby is sleeping and to pump after feedings to promote milk supply. Reports some pain at the beginning of feedings that does ease off. No questions at present. Encouraged to call for assist at next feeding.   Patient Name: Brandy Osborne Spinney WUJWJ'XToday's Date: 01/27/2015 Reason for consult: Follow-up assessment;Late preterm infant   Maternal Data Formula Feeding for Exclusion: No  Feeding    LATCH Score/Interventions                      Lactation Tools Discussed/Used     Consult Status Consult Status: Follow-up Date: 01/27/15 Follow-up type: In-patient    Pamelia HoitWeeks, Adelena Desantiago D 01/27/2015, 11:11 AM

## 2015-01-27 NOTE — Lactation Note (Signed)
This note was copied from the chart of Brandy Cristino MartesSangita Thurston. Lactation Consultation Note; Assisted mom with breast feeding. Baby was sleepy at the breast- would have some good sucking bursts but needed much stimulation to continue nursing. Very few swallows noted. Suggested supplementing after every feeding. Mom has not been supplementing since baby had been feeding for 25 min. I supplemented with Neosure by finger feeding with a syringe. Baby took 5 cc's then off to sleep and would not suck any more. Mom pumping- obtained a few cc's, To use at next feeding. No questions at present. To call for assist prn  Patient Name: Brandy Osborne ZOXWR'UToday's Date: 01/27/2015 Reason for consult: Follow-up assessment;Infant < 6lbs   Maternal Data Formula Feeding for Exclusion: No Has patient been taught Hand Expression?: Yes  Feeding Feeding Type: Formula Length of feed: 25 min  LATCH Score/Interventions Latch: Repeated attempts needed to sustain latch, nipple held in mouth throughout feeding, stimulation needed to elicit sucking reflex.  Audible Swallowing: A few with stimulation (very few swallows noted)  Type of Nipple: Everted at rest and after stimulation  Comfort (Breast/Nipple): Soft / non-tender     Hold (Positioning): Assistance needed to correctly position infant at breast and maintain latch.  LATCH Score: 7  Lactation Tools Discussed/Used     Consult Status Consult Status: Follow-up Date: 01/28/15 Follow-up type: In-patient    Pamelia HoitWeeks, Petina Muraski D 01/27/2015, 1:58 PM

## 2015-01-28 NOTE — Lactation Note (Addendum)
This note was copied from the chart of Brandy Osborne. Lactation Consultation Note  Patient Name: Brandy Cristino MartesSangita Placzek NFAOZ'HToday's Date: 01/28/2015 Reason for consult: Follow-up assessment;Infant < 6lbs;Late preterm infant   Follow up with parents of baby born at 7035 w 2 d gestation and is now 5059 hours old. Infant with 9 BF for 10-25 min, 4 bottle feeds of 5-18cc, 3 voids and 4 stools in last 24 hours. LATCH scores are 7-9. Infant weighs 5 lb 8.7 oz today with a 6% weight loss. Infant asleep in crib, mom said she fed recently. Mom reports she has not pumped or hand expressed today. Parents think that infant is nursing well and have been giving some bottles. Discussed LPT infant feeding behavior and recommendation to BF followed by formula/EBM supplementation after each BF. Discussed potential for engorgement and potential for decreased milk transfer of LPT infant and need to pump every 2-3 hours post BF followed by hand expression and to feed any expressed milk to infant prior to formula. Showed parents LPT infant handout and need to increase supplementation to 20-30 ml per day of age. Parents voiced understanding. Will need to follow up tomorrow and prn.   Maternal Data Formula Feeding for Exclusion: No Has patient been taught Hand Expression?: Yes Does the patient have breastfeeding experience prior to this delivery?: No  Feeding Length of feed: 15 min  LATCH Score/Interventions Latch: Grasps breast easily, tongue down, lips flanged, rhythmical sucking.  Audible Swallowing: A few with stimulation Intervention(s): Alternate breast massage  Type of Nipple: Everted at rest and after stimulation  Comfort (Breast/Nipple): Soft / non-tender     Hold (Positioning): No assistance needed to correctly position infant at breast.  LATCH Score: 9  Lactation Tools Discussed/Used Pump Review: Setup, frequency, and cleaning   Consult Status Consult Status: Follow-up Date: 01/29/15 Follow-up  type: In-patient    Silas FloodSharon S Hice 01/28/2015, 2:00 PM

## 2015-01-28 NOTE — Progress Notes (Signed)
Brandy Osborne [id number]  Subjective: Postpartum Day 2:  Primary C/S due to breech, SROM Patient up ad lib, reports no syncope or dizziness. Feeding:  Breast Contraceptive plan:  Undecided, pt and husband to discuss  Objective: Temp:  [97.7 F (36.5 C)-98.3 F (36.8 C)] 97.7 F (36.5 C) (12/14 0555) Pulse Rate:  [62-76] 62 (12/14 0555) Resp:  [18] 18 (12/14 0555) BP: (91-93)/(56) 91/56 mmHg (12/14 0555)  CBC Latest Ref Rng 01/27/2015 01/26/2015 01/25/2015  WBC 4.0 - 10.5 K/uL 14.1(H) 20.8(H) 14.7(H)  Hemoglobin 12.0 - 15.0 g/dL 4.0(J9.8(L) 11.2(L) 11.7(L)  Hematocrit 36.0 - 46.0 % 29.6(L) 32.5(L) 33.8(L)  Platelets 150 - 400 K/uL 146(L) 176 177     Physical Exam:  General: alert and cooperative Lochia: appropriate Uterine Fundus: firm Abdomen:  + bowel sounds, NT , mild gaseous distension Incision: Honeycomb dressing CDI  DVT Evaluation: No evidence of DVT seen on physical exam. Negative Homan's sign.  Assessment/Plan: Status post cesarean delivery, day 2. Stable Encourage ambulation Continue current care. Plan for discharge tomorrow    Brandy SeveranceRachel Damascus Feldpausch MSN, CNM 01/28/2015, 11:19 AM

## 2015-01-29 MED ORDER — OXYCODONE-ACETAMINOPHEN 5-325 MG PO TABS: 1.0000 | ORAL_TABLET | ORAL | Status: DC | PRN

## 2015-01-29 MED ORDER — SENNOSIDES-DOCUSATE SODIUM 8.6-50 MG PO TABS: 2.0000 | ORAL_TABLET | Freq: Every evening | ORAL | Status: DC | PRN

## 2015-01-29 MED ORDER — NORETHINDRONE 0.35 MG PO TABS: 1.0000 | ORAL_TABLET | Freq: Every day | ORAL | Status: DC

## 2015-01-29 MED ORDER — IBUPROFEN 600 MG PO TABS: 600.0000 mg | ORAL_TABLET | Freq: Four times a day (QID) | ORAL | Status: DC | PRN

## 2015-01-29 MED ORDER — FERROUS SULFATE 325 (65 FE) MG PO TABS: 325.0000 mg | ORAL_TABLET | Freq: Every day | ORAL | Status: DC

## 2015-01-29 NOTE — Lactation Note (Signed)
This note was copied from the chart of Girl Cristino MartesSangita Polcyn. Lactation Consultation Note  Patient Name: Girl Cristino MartesSangita Garant WUJWJ'XToday's Date: 01/29/2015 Reason for consult: Follow-up assessment;Infant < 6lbs;Late preterm infant   Parents had decided to rent pump for 2 weeks until they could get insurance pump. They have now decided to use SIL pump, discussed that pump are personal items and are meant to be used by 1 user. Dad voiced understanding. Dr. Erlinda HongMc Cormick plans to D/C infant with follow up with Ped. Tomorrow.    Maternal Data Formula Feeding for Exclusion: No Does the patient have breastfeeding experience prior to this delivery?: No  Feeding Feeding Type: Breast Fed Length of feed: 5 min  LATCH Score/Interventions Latch: Repeated attempts needed to sustain latch, nipple held in mouth throughout feeding, stimulation needed to elicit sucking reflex. Intervention(s): Breast compression;Breast massage  Audible Swallowing: Spontaneous and intermittent  Type of Nipple: Everted at rest and after stimulation  Comfort (Breast/Nipple): Soft / non-tender     Hold (Positioning): No assistance needed to correctly position infant at breast. Intervention(s): Breastfeeding basics reviewed;Support Pillows;Position options;Skin to skin  LATCH Score: 9  Lactation Tools Discussed/Used WIC Program: No   Consult Status Consult Status: Complete Follow-up type: Call as needed    Ed BlalockSharon S Hice 01/29/2015, 12:08 PM

## 2015-01-29 NOTE — Discharge Summary (Signed)
OB Discharge Summary     Patient Name: Brandy Osborne DOB: 1985/06/21 MRN: [id number]  Date of admission: 01/25/2015 Delivering MD: Jaymes Graff   Date of discharge: 01/29/2015  Admitting diagnosis: 35 WKS, WATER BROKE Intrauterine pregnancy: [redacted]w[redacted]d     Secondary diagnosis:  Principal Problem:   S/P C-section Active Problems:   Bicornate uterus   Gestational diabetes mellitus (GDM)   Preterm premature rupture of membranes  Additional problems: none     Discharge diagnosis: Preterm Pregnancy Delivered                                                                                                Post partum procedures:none  Augmentation: N/A  Complications: None  Hospital course:  Onset of Labor With Unplanned C/S  29 y.o. yo G1P0101 at [redacted]w[redacted]d was admitted in Latent Labor on 01/25/2015. Patient had a labor course significant for Breech presentation and Premature preterm membrane Rupture Time/Date: 9:30 PM ,01/25/2015   The patient went for cesarean section due to Malpresentation and Premature preterm rupture of membranes, and delivered a Viable infant,01/26/2015  Details of operation can be found in separate operative note. Patient had an uncomplicated postpartum course.  She is ambulating,tolerating a regular diet, passing flatus, and urinating well.  Patient is discharged home in stable condition No discharge date for patient encounter.Marland Kitchen   Physical exam  Filed Vitals:   01/27/15 1804 01/28/15 0555 01/28/15 1802 01/29/15 0727  BP: 93/56 91/56 100/61 94/61  Pulse: 76 62 64 62  Temp: 98.3 F (36.8 C) 97.7 F (36.5 C) 98.1 F (36.7 C) 97.6 F (36.4 C)  TempSrc: Oral Oral Oral Oral  Resp: Height:      Weight:      SpO2:       General: alert and cooperative Lochia: appropriate Uterine Fundus: firm Incision: Healing well with no significant drainage DVT Evaluation: No evidence of DVT seen on physical exam. Negative Homan's sign. Labs: Lab Results   Component Value Date   WBC 14.1* 01/27/2015   HGB 9.8* 01/27/2015   HCT 29.6* 01/27/2015   MCV 89.4 01/27/2015   PLT 146* 01/27/2015   No flowsheet data found.  Discharge instruction: per After Visit Summary and "Baby and Me Booklet".  After visit meds:    Medication List    STOP taking these medications        glyBURIDE 2.5 MG tablet  Commonly known as:  DIABETA      TAKE these medications        ferrous sulfate 325 (65 FE) MG tablet  Take 1 tablet (325 mg total) by mouth daily with breakfast.     ibuprofen 600 MG tablet  Commonly known as:  ADVIL,MOTRIN  Take 1 tablet (600 mg total) by mouth every 6 (six) hours as needed.     norethindrone 0.35 MG tablet  Commonly known as:  ORTHO MICRONOR  Take 1 tablet (0.35 mg total) by mouth daily.  Start taking on:  02/19/2015     oxyCODONE-acetaminophen 5-325 MG tablet  Commonly known as:  PERCOCET/ROXICET  Take  1 tablet by mouth every 4 (four) hours as needed (for pain scale 4-7).     PRENATAL VITAMIN PO  Take by mouth.     senna-docusate 8.6-50 MG tablet  Commonly known as:  Senokot-S  Take 2 tablets by mouth at bedtime as needed for mild constipation.        Diet: routine diet  Activity: Advance as tolerated. Pelvic rest for 6 weeks.   Outpatient follow up:6 weeks Follow up Appt:No future appointments. Follow up Visit:No Follow-up on file.  Postpartum contraception: Progesterone only pills  Newborn Data: Live born female  Birth Weight: 5 lb 14.5 oz (2680 g) APGAR: 8, 9  Baby Feeding: Breast Disposition:home with mother   01/29/2015 Alphonzo Severanceachel Byanca Kasper, CNM

## 2015-01-29 NOTE — Lactation Note (Addendum)
This note was copied from the chart of Brandy Cristino MartesSangita Iseman. Lactation Consultation Note  Patient Name: Brandy Osborne UEAVW'UToday's Date: 01/29/2015 Reason for consult: Follow-up assessment;Infant < 6lbs;Late preterm infant   Follow up with LPT infant at 2878 hours old. Infant with 8 BF for 15-20 minutes, 1 attempt, EBM supplementation of 12 cc, and bottle of formula x 3 of 18-25 cc, 6 voids and 5 stools. Infant 8 lb 8.5 oz with 6% weight loss since birth, decreased 0.3 oz last 24 hours. Mom reports her breasts are filling and denies nipple tenderness. Infant with cluster feeding through early morning hours and fed every hour per mom. Mom pumped once yesterday evening. Discussed LPT infant behavior/plan with parents. Enc parent to supplement infant after at least 4 BF and to increase supplement amount to 30+ ml. Parents report they did not supplement as infant is on and off the breast. Discussed typical LPT infant feeding behaviors and need for additional calories with family. Enc mom to pump and use her EBM for supplementation and discussed need for pumping to protect mom's milk supple. Dad to call [employer] and see if they can get a breast pump. Discussed pump rental options with family and will follow up prior to D/C. Infant went to breast for 5 minutes while I was in room, mom very responsive to infant cues. Mom latched infant independently, Infant nursed actively with frequent swallows noted. Reviewed all BF information in Taking Care of Baby and Me Booklet. Reviewed engorgement prevention and how to use manual pump. [facility] brochure with parents, aware of Support Groups, OP Services and phone #. Will recommend scheduling a followup OP appointment prior to D/C. Infant with follow up Ped appointment tomorrow.    Maternal Data Formula Feeding for Exclusion: No Does the patient have breastfeeding experience prior to this delivery?: No  Feeding Feeding Type: Breast Fed Nipple Type: Slow -  flow Length of feed: 5 min  LATCH Score/Interventions Latch: Repeated attempts needed to sustain latch, nipple held in mouth throughout feeding, stimulation needed to elicit sucking reflex. Intervention(s): Breast compression;Breast massage  Audible Swallowing: Spontaneous and intermittent  Type of Nipple: Everted at rest and after stimulation  Comfort (Breast/Nipple): Soft / non-tender     Hold (Positioning): No assistance needed to correctly position infant at breast. Intervention(s): Breastfeeding basics reviewed;Support Pillows;Position options;Skin to skin  LATCH Score: 9  Lactation Tools Discussed/Used WIC Program: No   Consult Status Consult Status: Complete Follow-up type: Call as needed    Ed BlalockSharon S Serenitee Fuertes 01/29/2015, 9:04 AM

## 2015-01-29 NOTE — Discharge Instructions (Signed)
Postpartum Care After Cesarean Delivery °After you deliver your newborn (postpartum period), the usual stay in the hospital is 24-72 hours. If there were problems with your labor or delivery, or if you have other medical problems, you might be in the hospital longer.  °While you are in the hospital, you will receive help and instructions on how to care for yourself and your newborn during the postpartum period.  °While you are in the hospital: °· It is normal for you to have pain or discomfort from the incision in your abdomen. Be sure to tell your nurses when you are having pain, where the pain is located, and what makes the pain worse. °· If you are breastfeeding, you may feel uncomfortable contractions of your uterus for a couple of weeks. This is normal. The contractions help your uterus get back to normal size. °· It is normal to have some bleeding after delivery. °· For the first 1-3 days after delivery, the flow is red and the amount may be similar to a period. °· It is common for the flow to start and stop. °· In the first few days, you may pass some small clots. Let your nurses know if you begin to pass large clots or your flow increases. °· Do not  flush blood clots down the toilet before having the nurse look at them. °· During the next 3-10 days after delivery, your flow should become more watery and pink or brown-tinged in color. °· Ten to fourteen days after delivery, your flow should be a small amount of yellowish-white discharge. °· The amount of your flow will decrease over the first few weeks after delivery. Your flow may stop in 6-8 weeks. Most women have had their flow stop by 12 weeks after delivery. °· You should change your sanitary pads frequently. °· Wash your hands thoroughly with soap and water for at least 20 seconds after changing pads, using the toilet, or before holding or feeding your newborn. °· Your intravenous (IV) tubing will be removed when you are drinking enough fluids. °· The  urine drainage tube (urinary catheter) that was inserted before delivery may be removed within 6-8 hours after delivery or when feeling returns to your legs. You should feel like you need to empty your bladder within the first 6-8 hours after the catheter has been removed. °· In case you become weak, lightheaded, or faint, call your nurse before you get out of bed for the first time and before you take a shower for the first time. °· Within the first few days after delivery, your breasts may begin to feel tender and full. This is called engorgement. Breast tenderness usually goes away within 48-72 hours after engorgement occurs. You may also notice milk leaking from your breasts. If you are not breastfeeding, do not stimulate your breasts. Breast stimulation can make your breasts produce more milk. °· Spending as much time as possible with your newborn is very important. During this time, you and your newborn can feel close and get to know each other. Having your newborn stay in your room (rooming in) will help to strengthen the bond with your newborn. It will give you time to get to know your newborn and become comfortable caring for your newborn. °· Your hormones change after delivery. Sometimes the hormone changes can temporarily cause you to feel sad or tearful. These feelings should not last more than a few days. If these feelings last longer than that, you should talk to your   caregiver.  If desired, talk to your caregiver about methods of family planning or contraception.  Talk to your caregiver about immunizations. Your caregiver may want you to have the following immunizations before leaving the hospital:  Tetanus, diphtheria, and pertussis (Tdap) or tetanus and diphtheria (Td) immunization. It is very important that you and your family (including grandparents) or others caring for your newborn are up-to-date with the Tdap or Td immunizations. The Tdap or Td immunization can help protect your newborn  from getting ill.  Rubella immunization.  Varicella (chickenpox) immunization.  Influenza immunization. You should receive this annual immunization if you did not receive the immunization during your pregnancy.   This information is not intended to replace advice given to you by your health care provider. Make sure you discuss any questions you have with your health care provider.   Document Released: 10/26/2011 Document Reviewed: 10/26/2011 Elsevier Interactive Patient Education 2016 [employer].    Iron-Rich Diet  Iron is a mineral that helps your body to produce hemoglobin. Hemoglobin is a protein in your red blood cells that carries oxygen to your body's tissues. Eating too little iron may cause you to feel weak and tired, and it can increase your risk for infection. Eating enough iron is necessary for your body's metabolism, muscle function, and nervous system. Iron is naturally found in many foods. It can also be added to foods or fortified in foods. There are two types of dietary iron:  Heme iron. Heme iron is absorbed by the body more easily than nonheme iron. Heme iron is found in meat, poultry, and fish.  Nonheme iron. Nonheme iron is found in dietary supplements, iron-fortified grains, beans, and vegetables. You may need to follow an iron-rich diet if:  You have been diagnosed with iron deficiency or iron-deficiency anemia.  You have a condition that prevents you from absorbing dietary iron, such as:  Infection in your intestines.  Celiac disease. This involves long-lasting (chronic) inflammation of your intestines.  You do not eat enough iron.  You eat a diet that is high in foods that impair iron absorption.  You have lost a lot of blood.  You have heavy bleeding during your menstrual cycle.  You are pregnant. WHAT IS MY PLAN? Your health care provider may help you to determine how much iron you need per day based on your condition. Generally, when a person  consumes sufficient amounts of iron in the diet, the following iron needs are met:  Men.  30-79 years old: 11 mg per day.  32-1 years old: 8 mg per day.  Women.   72-77 years old: 15 mg per day.  35-35 years old: 18 mg per day.  Over 68 years old: 8 mg per day.  Pregnant women: 27 mg per day.  Breastfeeding women: 9 mg per day. WHAT DO I NEED TO KNOW ABOUT AN IRON-RICH DIET?  Eat fresh fruits and vegetables that are high in vitamin C along with foods that are high in iron. This will help increase the amount of iron that your body absorbs from food, especially with foods containing nonheme iron. Foods that are high in vitamin C include oranges, peppers, tomatoes, and mango.  Take iron supplements only as directed by your health care provider. Overdose of iron can be life-threatening. If you were prescribed iron supplements, take them with orange juice or a vitamin C supplement.  Cook foods in pots and pans that are made from iron.   Eat nonheme iron-containing foods  alongside foods that are high in heme iron. This helps to improve your iron absorption.   Certain foods and drinks contain compounds that impair iron absorption. Avoid eating these foods in the same meal as iron-rich foods or with iron supplements. These include:  Coffee, black tea, and red wine.  Milk, dairy products, and foods that are high in calcium.  Beans, soybeans, and peas.  Whole grains.  When eating foods that contain both nonheme iron and compounds that impair iron absorption, follow these tips to absorb iron better.   Soak beans overnight before cooking.  Soak whole grains overnight and drain them before using.  Ferment flours before baking, such as using yeast in bread dough. WHAT FOODS CAN I EAT? Grains Iron-fortified breakfast cereal. Iron-fortified whole-wheat bread. Enriched rice. Sprouted grains. Vegetables Spinach. Potatoes with skin. Green peas. Broccoli. Red and green bell  peppers. Fermented vegetables. Fruits Prunes. Raisins. Oranges. Strawberries. Mango. Grapefruit. Meats and Other Protein Sources Beef liver. Oysters. Beef. Shrimp. [place]. Chicken. [place]. Sardines. Chickpeas. Nuts. Tofu. Beverages Tomato juice. Fresh orange juice. Prune juice. Hibiscus tea. Fortified instant breakfast shakes. Condiments Tahini. Fermented soy sauce. Sweets and Desserts Black-strap molasses.  Other Wheat germ. The items listed above may not be a complete list of recommended foods or beverages. Contact your dietitian for more options. WHAT FOODS ARE NOT RECOMMENDED? Grains Whole grains. Bran cereal. Bran flour. Oats. Vegetables Artichokes. Brussels sprouts. Kale. Fruits Blueberries. Raspberries. Strawberries. Figs. Meats and Other Protein Sources Soybeans. Products made from soy protein. Dairy Milk. Cream. Cheese. Yogurt. Cottage cheese. Beverages Coffee. Black tea. Red wine. Sweets and Desserts Cocoa. Chocolate. Ice cream. Other Basil. Oregano. Parsley. The items listed above may not be a complete list of foods and beverages to avoid. Contact your dietitian for more information.   This information is not intended to replace advice given to you by your health care provider. Make sure you discuss any questions you have with your health care provider.   Document Released: 09/14/2004 Document Revised: 02/21/2014 Document Reviewed: 08/28/2013 Elsevier Interactive Patient Education 2016 [employer]. Norethindrone tablets (contraception) What is this medicine? NORETHINDRONE (nor eth IN drone) is an oral contraceptive. The product contains a female hormone known as a progestin. It is used to prevent pregnancy. This medicine may be used for other purposes; ask your health care provider or pharmacist if you have questions. What should I tell my health care provider before I take this medicine? They need to know if you have any of these  conditions: -blood vessel disease or blood clots -breast, cervical, or vaginal cancer -diabetes -heart disease -kidney disease -liver disease -mental depression -migraine -seizures -stroke -vaginal bleeding -an unusual or allergic reaction to norethindrone, other medicines, foods, dyes, or preservatives -pregnant or trying to get pregnant -breast-feeding How should I use this medicine? Take this medicine by mouth with a glass of water. You may take it with or without food. Follow the directions on the prescription label. Take this medicine at the same time each day and in the order directed on the package. Do not take your medicine more often than directed. Contact your pediatrician regarding the use of this medicine in children. Special care may be needed. This medicine has been used in female children who have started having menstrual periods. A patient package insert for the product will be given with each prescription and refill. Read this sheet carefully each time. The sheet may change frequently. Overdosage: If you think you have taken too much of this  medicine contact a poison control center or emergency room at once. NOTE: This medicine is only for you. Do not share this medicine with others. What if I miss a dose? Try not to miss a dose. Every time you miss a dose or take a dose late your chance of pregnancy increases. When 1 pill is missed (even if only 3 hours late), take the missed pill as soon as possible and continue taking a pill each day at the regular time (use a back up method of birth control for the next 48 hours). If more than 1 dose is missed, use an additional birth control method for the rest of your pill pack until menses occurs. Contact your health care professional if more than 1 dose has been missed. What may interact with this medicine? Do not take this medicine with any of the following medications: -amprenavir or fosamprenavir -bosentan This medicine may also  interact with the following medications: -antibiotics or medicines for infections, especially rifampin, rifabutin, rifapentine, and griseofulvin, and possibly penicillins or tetracyclines -aprepitant -barbiturate medicines, such as phenobarbital -carbamazepine -felbamate -modafinil -oxcarbazepine -phenytoin -ritonavir or other medicines for HIV infection or AIDS -St. John's wort -topiramate This list may not describe all possible interactions. Give your health care provider a list of all the medicines, herbs, non-prescription drugs, or dietary supplements you use. Also tell them if you smoke, drink alcohol, or use illegal drugs. Some items may interact with your medicine. What should I watch for while using this medicine? Visit your doctor or health care professional for regular checks on your progress. You will need a regular breast and pelvic exam and Pap smear while on this medicine. Use an additional method of birth control during the first cycle that you take these tablets. If you have any reason to think you are pregnant, stop taking this medicine right away and contact your doctor or health care professional. If you are taking this medicine for hormone related problems, it may take several cycles of use to see improvement in your condition. This medicine does not protect you against HIV infection (AIDS) or any other sexually transmitted diseases. What side effects may I notice from receiving this medicine? Side effects that you should report to your doctor or health care professional as soon as possible: -breast tenderness or discharge -pain in the abdomen, chest, groin or leg -severe headache -skin rash, itching, or hives -sudden shortness of breath -unusually weak or tired -vision or speech problems -yellowing of skin or eyes Side effects that usually do not require medical attention (report to your doctor or health care professional if they continue or are  bothersome): -changes in sexual desire -change in menstrual flow -facial hair growth -fluid retention and swelling -headache -irritability -nausea -weight gain or loss This list may not describe all possible side effects. Call your doctor for medical advice about side effects. You may report side effects to FDA at 1-800-FDA-1088. Where should I keep my medicine? Keep out of the reach of children. Store at room temperature between 15 and 30 degrees C (59 and 86 degrees F). Throw away any unused medicine after the expiration date. NOTE: This sheet is a summary. It may not cover all possible information. If you have questions about this medicine, talk to your doctor, pharmacist, or health care provider.    2016, Elsevier/[employer] Standard. (2011-10-21 16:41:35)

## 2020-11-11 ENCOUNTER — Emergency Department (HOSPITAL_BASED_OUTPATIENT_CLINIC_OR_DEPARTMENT_OTHER)
Admission: EM | Admit: 2020-11-11 | Discharge: 2020-11-11 | Disposition: A | Discharge status: Home / Self Care | Payer: 59 | Attending: Emergency Medicine | Admitting: Emergency Medicine

## 2020-11-11 ENCOUNTER — Encounter (HOSPITAL_BASED_OUTPATIENT_CLINIC_OR_DEPARTMENT_OTHER): Payer: Self-pay | Admitting: Emergency Medicine

## 2020-11-11 ENCOUNTER — Emergency Department (HOSPITAL_BASED_OUTPATIENT_CLINIC_OR_DEPARTMENT_OTHER): Payer: 59

## 2020-11-11 ENCOUNTER — Other Ambulatory Visit: Payer: Self-pay

## 2020-11-11 DIAGNOSIS — O200 Threatened abortion: Secondary | ICD-10-CM

## 2020-11-11 DIAGNOSIS — A64 Unspecified sexually transmitted disease: Secondary | ICD-10-CM | POA: Diagnosis not present

## 2020-11-11 DIAGNOSIS — O4690 Antepartum hemorrhage, unspecified, unspecified trimester: Secondary | ICD-10-CM

## 2020-11-11 DIAGNOSIS — Z3A14 14 weeks gestation of pregnancy: Secondary | ICD-10-CM | POA: Diagnosis not present

## 2020-11-11 LAB — URINALYSIS, MICROSCOPIC (REFLEX)

## 2020-11-11 LAB — COMPREHENSIVE METABOLIC PANEL
ALT: 14 U/L (ref 0–44)
AST: 17 U/L (ref 15–41)
Albumin: 3.5 g/dL (ref 3.5–5.0)
Alkaline Phosphatase: 44 U/L (ref 38–126)
Anion gap: 6 (ref 5–15)
BUN: 7 mg/dL (ref 6–20)
CO2: 24 mmol/L (ref 22–32)
Calcium: 8.8 mg/dL — ABNORMAL LOW (ref 8.9–10.3)
Chloride: 104 mmol/L (ref 98–111)
Creatinine, Ser: 0.46 mg/dL (ref 0.44–1.00)
GFR, Estimated: >60 mL/min (ref >60)
Glucose, Bld: 102 mg/dL — ABNORMAL HIGH (ref 70–99)
Potassium: 3.3 mmol/L — ABNORMAL LOW (ref 3.5–5.1)
Sodium: 134 mmol/L — ABNORMAL LOW (ref 135–145)
Total Bilirubin: 0.1 mg/dL — ABNORMAL LOW (ref 0.3–1.2)
Total Protein: 7.5 g/dL (ref 6.5–8.1)

## 2020-11-11 LAB — CBC WITH DIFFERENTIAL/PLATELET
Abs Immature Granulocytes: 0.09 10*3/uL — ABNORMAL HIGH (ref 0.00–0.07)
Basophils Absolute: 0 10*3/uL (ref 0.0–0.1)
Basophils Relative: 0 %
Eosinophils Absolute: 0.1 10*3/uL (ref 0.0–0.5)
Eosinophils Relative: 2 %
HCT: 31.4 % — ABNORMAL LOW (ref 36.0–46.0)
Hemoglobin: 10.9 g/dL — ABNORMAL LOW (ref 12.0–15.0)
Immature Granulocytes: 1 %
Lymphocytes Relative: 19 %
Lymphs Abs: 1.6 10*3/uL (ref 0.7–4.0)
MCH: 29.9 pg (ref 26.0–34.0)
MCHC: 34.7 g/dL (ref 30.0–36.0)
MCV: 86.3 fL (ref 80.0–100.0)
Monocytes Absolute: 0.7 10*3/uL (ref 0.1–1.0)
Monocytes Relative: 8 %
Neutro Abs: 5.7 10*3/uL (ref 1.7–7.7)
Neutrophils Relative %: 70 %
Platelets: 196 10*3/uL (ref 150–400)
RBC: 3.64 MIL/uL — ABNORMAL LOW (ref 3.87–5.11)
RDW: 14.3 % (ref 11.5–15.5)
WBC: 8.2 10*3/uL (ref 4.0–10.5)
nRBC: 0 % (ref 0.0–0.2)

## 2020-11-11 LAB — URINALYSIS, ROUTINE W REFLEX MICROSCOPIC
Bilirubin Urine: NEGATIVE
Glucose, UA: NEGATIVE mg/dL
Ketones, ur: NEGATIVE mg/dL
Nitrite: NEGATIVE
Protein, ur: NEGATIVE mg/dL
Specific Gravity, Urine: 1.01 (ref 1.005–1.030)
pH: 7 (ref 5.0–8.0)

## 2020-11-11 LAB — WET PREP, GENITAL
Clue Cells Wet Prep HPF POC: NONE SEEN
Sperm: NONE SEEN
Trich, Wet Prep: NONE SEEN
Yeast Wet Prep HPF POC: NONE SEEN

## 2020-11-11 LAB — HCG, SERUM, QUALITATIVE: Preg, Serum: POSITIVE — AB

## 2020-11-11 LAB — HCG, QUANTITATIVE, PREGNANCY: hCG, Beta Chain, Quant, S: 95334 m[IU]/mL — ABNORMAL HIGH (ref <5)

## 2020-11-11 MED ORDER — LIDOCAINE HCL (PF) 1 % IJ SOLN
2.1000 mL | Freq: Once | INTRAMUSCULAR | Status: AC
  Administered 2020-11-11: 2.1 mL
  Filled 2020-11-11: qty 5

## 2020-11-11 MED ORDER — AZITHROMYCIN 250 MG PO TABS
1000.0000 mg | ORAL_TABLET | Freq: Once | ORAL | Status: AC
  Administered 2020-11-11: 1000 mg via ORAL
  Filled 2020-11-11: qty 4

## 2020-11-11 MED ORDER — CEFTRIAXONE SODIUM 1 G IJ SOLR
1.0000 g | Freq: Once | INTRAMUSCULAR | Status: AC
  Administered 2020-11-11: 1 g via INTRAMUSCULAR
  Filled 2020-11-11: qty 10

## 2020-11-11 NOTE — Discharge Instructions (Addendum)
As we discussed your exam today was significant for a threatened miscarriage.  You are not actively hemorrhaging out of your cervix.  You do have an intrauterine pregnancy of approximately 14 weeks.  We have treated you for a potential STI versus UTI.  Please refer to your chart for evaluation of the cultures that we ran today.  We recommend that you begin a prenatal vitamin.  I do recommend that you follow-up with an OB/GYN at your earliest convenience.

## 2020-11-11 NOTE — ED Triage Notes (Signed)
Pt reports spotting since last pm; [redacted] wks pregnant; denies pain

## 2020-11-11 NOTE — ED Notes (Signed)
Pt changing to gown 

## 2020-11-11 NOTE — ED Notes (Signed)
ED Provider at bedside. 

## 2020-11-11 NOTE — ED Provider Notes (Signed)
[facility] EMERGENCY DEPARTMENT Provider Note   CSN: [id number] Arrival date & time: 11/11/20  1157     History Chief Complaint  Patient presents with  . Vaginal Bleeding    Brandy Osborne is a 35 y.o. 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  Patient reports she noticed some light bleeding, small clots.  Patient reports she just returned from [place], had a confirmation of intrauterine pregnancy while she was in [place].  Patient denies trauma to the abdomen.  Patient denies nausea, vomiting.  Patient denies dysuria, urinary frequency, dyspareunia, or vaginal discharge at this time.  Patient is A positive blood type, does not require RhoGAM.  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  She denies history of preeclampsia, eclampsia, gestational hypertension.   Vaginal Bleeding     Past Medical History:  Diagnosis Date  . GDM (gestational diabetes mellitus)   . Medical history non-contributory     Patient Active Problem List   Diagnosis Date Noted  . S/P C-section 01/26/2015  . 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 Gestational diabetes mellitus (GDM) 01/25/2015  . Preterm premature rupture of membranes 01/25/2015    Past Surgical History:  Procedure Laterality Date  . CESAREAN SECTION N/A 01/26/2015   Procedure: CESAREAN SECTION;  Surgeon: Jaymes Graff, MD;  Location: [facility] ORS;  Service: Obstetrics;  Laterality: N/A;  . NO PAST SURGERIES    . UTERINE SEPTUM RESECTION       OB History     Gravida  2   Para  1   Term      Preterm  1   AB      Living  1      SAB      IAB      Ectopic      Multiple  0   Live Births  1           No family history on file.  Social History   Tobacco Use  . Smoking status: Never  .  Smokeless tobacco: Never  Substance Use Topics  . Alcohol use: No  . Drug use: No    Home Medications Prior to Admission medications   Medication Sig Start Date End Date Taking? Authorizing Provider  FOLIC ACID PO Take 1 tablet by mouth daily.   Yes [provider]  ferrous sulfate 325 (65 FE) MG tablet Take 1 tablet (325 mg total) by mouth daily with breakfast. Patient not taking: No sig reported 01/29/15   Alphonzo Severance, CNM  ibuprofen (ADVIL,MOTRIN) 600 MG tablet Take 1 tablet (600 mg total) by mouth every 6 (six) hours as needed. Patient not taking: No sig reported 01/29/15   Alphonzo Severance, CNM  norethindrone (ORTHO MICRONOR) 0.35 MG tablet Take 1 tablet (0.35 mg total) by mouth daily. Patient not taking: No sig reported 02/19/15   Alphonzo Severance, CNM  oxyCODONE-acetaminophen (PERCOCET/ROXICET) 5-325 MG tablet Take 1 tablet by mouth every 4 (four) hours as needed (for pain scale 4-7). Patient not taking: No sig reported 01/29/15   Alphonzo Severance, CNM  senna-docusate (SENOKOT-S) 8.6-50 MG tablet Take 2 tablets by mouth at bedtime as needed for mild constipation. Patient not taking: No sig reported 01/29/15   Alphonzo Severance, CNM    Allergies    Patient has no known allergies.  Review of Systems   Review of Systems  Genitourinary:  Positive for vaginal bleeding.  All other systems reviewed and are negative.  Physical Exam Updated Vital Signs BP 100/64 (BP Location: Right Arm)   Pulse 85   Temp 98.1 F (36.7 C) (Oral)   Resp 16   Ht 5\' 1"  (1.549 m)   Wt 54.4 kg   LMP 08/03/2020   SpO2 100%   BMI 22.67 kg/m   Physical Exam Vitals and nursing note reviewed.  Constitutional:      General: She is not in acute distress.    Appearance: Normal appearance.  HENT:     Head: Normocephalic and atraumatic.  Eyes:     General:        Right eye: No discharge.        Left eye: No discharge.  Cardiovascular:     Rate and Rhythm: Normal rate and regular rhythm.     Heart  sounds: No murmur heard.   No friction rub. No gallop.  Pulmonary:     Effort: Pulmonary effort is normal.     Breath sounds: Normal breath sounds.  Abdominal:     General: Bowel sounds are normal.     Palpations: Abdomen is soft.     Comments: Uterine fundus at approximately the level of the umbilicus.  Patient has no tenderness to palpation across the entire abdomen.  No suprapubic tenderness.  Genitourinary:    General: Normal vulva.     Comments: Minimal bleeding from cervical os is closed, multiparous.  There is some yellow-greenish discharge around the cervical os.  Some pain with speculum exam.  Minimal pain with bimanual.  No appreciable mass, no adnexal tenderness palpated.  There are no products of conception in the vaginal vault. Skin:    General: Skin is warm and dry.     Capillary Refill: Capillary refill takes less than 2 seconds.  Neurological:     Mental Status: She is alert and oriented to person, place, and time.  Psychiatric:        Mood and Affect: Mood normal.        Behavior: Behavior normal.    ED Results / Procedures / Treatments   Labs (all labs ordered are listed, but only abnormal results are displayed) Labs Reviewed  WET PREP, GENITAL - Abnormal; Notable for the following components:      Result Value   WBC, Wet Prep HPF POC MANY (*)    All other components within normal limits  URINALYSIS, ROUTINE W REFLEX MICROSCOPIC - Abnormal; Notable for the following components:   Hgb urine dipstick SMALL (*)    Leukocytes,Ua TRACE (*)    All other components within normal limits  CBC WITH DIFFERENTIAL/PLATELET - Abnormal; Notable for the following components:   RBC 3.64 (*)    Hemoglobin 10.9 (*)    HCT 31.4 (*)    Abs Immature Granulocytes 0.09 (*)    All other components within normal limits  COMPREHENSIVE METABOLIC PANEL - Abnormal; Notable for the following components:   Sodium 134 (*)    Potassium 3.3 (*)    Glucose, Bld 102 (*)    Calcium 8.8 (*)     Total Bilirubin 0.1 (*)    All other components within normal limits  HCG, SERUM, QUALITATIVE - Abnormal; Notable for the following components:   Preg, Serum POSITIVE (*)    All other components within normal limits  HCG, QUANTITATIVE, PREGNANCY - Abnormal; Notable for the following components:  hCG, Beta Chain, Quant, S 95,334 (*)    All other components within normal limits  URINALYSIS, MICROSCOPIC (REFLEX) - Abnormal; Notable for the following components:   Bacteria, UA FEW (*)    All other components within normal limits  GC/CHLAMYDIA PROBE AMP ([place]) NOT AT [facility]    EKG None  Radiology US OB Limited  Result Date: 11/11/2020 CLINICAL DATA:  Vaginal bleeding during pregnancy EXAM: LIMITED OBSTETRIC ULTRASOUND COMPARISON:  None. FINDINGS: Number of Fetuses: 1 Heart Rate:  157 bpm Movement: Yes Presentation: Cephalic Placental Location: Posterior Previa: No Amniotic Fluid (Subjective):  Normal BPD: 2.5 cm 14 w  2 d MATERNAL FINDINGS: Cervix:  Appears closed. Uterus/Adnexae: No adnexal masses or free fluid. IMPRESSION: 1. Single live intrauterine pregnancy as above, estimated age 45 weeks and 2 days. This exam is performed on an emergent basis and does not comprehensively evaluate fetal size, dating, or anatomy; follow-up complete OB US should be considered if further fetal assessment is warranted. Electronically Signed   By: Sharlet Salina M.D.   On: 11/11/2020 16:18    Procedures Procedures   Medications Ordered in ED Medications  cefTRIAXone (ROCEPHIN) injection 1 g (1 g Intramuscular Given 11/11/20 1703)  lidocaine (PF) (XYLOCAINE) 1 % injection 2.1 mL (2.1 mLs Other Given 11/11/20 1704)  azithromycin (ZITHROMAX) tablet 1,000 mg (1,000 mg Oral Given 11/11/20 1701)    ED Course  I have reviewed the triage vital signs and the nursing notes.  Pertinent labs & imaging results that were available during my care of the patient were reviewed by me and considered in my medical  decision making (see chart for details).    MDM Rules/Calculators/A&P                         I discussed this case with my attending physician who cosigned this note including patient's presenting symptoms, physical exam, and planned diagnostics and interventions. Attending physician stated agreement with plan or made changes to plan which were implemented.   Pelvic exam reveals closed multiparous cervical os, threatened miscarriage most likely diagnosis.  Patient also having some yellow-greenish discharge, without foul smell, without dyspareunia, pain.  Possible infection, will acquire testing.  Ultrasound reveals intrauterine pregnancy at approximately 14 weeks. Consistent with quantitative hcg of 90k.  Urinalysis significant for trace hemoglobin and leukocytes.  Patient's presentations and symptoms significant with STI versus early UTI.  We will treat for STI, ceftriaxone should cover any UTI at the same time.  We will give ceftriaxone, and azithromycin.  We will send GC/chlamydia probe for further evaluation.  Patient encouraged to take prenatal vitamin.  Mild anemia, discussed possible iron intake increase versus conservative monitoring.  Recommend follow-up with OB/GYN at patient's earliest convenience for official ultrasound.  Patient in stable condition.  Appropriate for discharge at this time.  Return precautions were given.  Final Clinical Impression(s) / ED Diagnoses Final diagnoses:  Vaginal bleeding during pregnancy  Threatened miscarriage  STI (sexually transmitted infection)    Rx / DC Orders ED Discharge Orders     None        [place] 11/11/20 1728    Gloris Manchester, MD 11/12/20 [phone]

## 2020-11-12 LAB — GC/CHLAMYDIA PROBE AMP (~~LOC~~) NOT AT ARMC
Chlamydia: NEGATIVE
Comment: NEGATIVE
Comment: NORMAL
Neisseria Gonorrhea: NEGATIVE

## 2020-11-14 ENCOUNTER — Other Ambulatory Visit (HOSPITAL_BASED_OUTPATIENT_CLINIC_OR_DEPARTMENT_OTHER): Payer: Self-pay | Admitting: Physician Assistant

## 2021-04-23 ENCOUNTER — Inpatient Hospital Stay (HOSPITAL_COMMUNITY)
Admission: AD | Admit: 2021-04-23 | Discharge: 2021-04-25 | DRG: 786 | Disposition: A | Discharge status: Home / Self Care | Payer: 59 | Attending: Obstetrics and Gynecology | Admitting: Obstetrics and Gynecology

## 2021-04-23 ENCOUNTER — Encounter (HOSPITAL_COMMUNITY): Payer: Self-pay

## 2021-04-23 ENCOUNTER — Inpatient Hospital Stay (HOSPITAL_COMMUNITY): Payer: 59 | Admitting: Anesthesiology

## 2021-04-23 ENCOUNTER — Encounter (HOSPITAL_COMMUNITY)
Admission: AD | Disposition: A | Discharge status: Home / Self Care | Payer: Self-pay | Source: Home / Self Care | Attending: Obstetrics and Gynecology

## 2021-04-23 DIAGNOSIS — Z98891 History of uterine scar from previous surgery: Secondary | ICD-10-CM

## 2021-04-23 DIAGNOSIS — O4593 Premature separation of placenta, unspecified, third trimester: Secondary | ICD-10-CM | POA: Diagnosis not present

## 2021-04-23 DIAGNOSIS — O24429 Gestational diabetes mellitus in childbirth, unspecified control: Secondary | ICD-10-CM | POA: Diagnosis not present

## 2021-04-23 DIAGNOSIS — O9081 Anemia of the puerperium: Secondary | ICD-10-CM | POA: Diagnosis not present

## 2021-04-23 DIAGNOSIS — Z3A37 37 weeks gestation of pregnancy: Secondary | ICD-10-CM | POA: Diagnosis not present

## 2021-04-23 DIAGNOSIS — D62 Acute posthemorrhagic anemia: Secondary | ICD-10-CM | POA: Diagnosis not present

## 2021-04-23 DIAGNOSIS — Z20822 Contact with and (suspected) exposure to covid-19: Secondary | ICD-10-CM | POA: Diagnosis present

## 2021-04-23 DIAGNOSIS — O3403 Maternal care for unspecified congenital malformation of uterus, third trimester: Secondary | ICD-10-CM | POA: Diagnosis present

## 2021-04-23 DIAGNOSIS — O34211 Maternal care for low transverse scar from previous cesarean delivery: Secondary | ICD-10-CM | POA: Diagnosis present

## 2021-04-23 DIAGNOSIS — O322XX0 Maternal care for transverse and oblique lie, not applicable or unspecified: Secondary | ICD-10-CM | POA: Diagnosis not present

## 2021-04-23 DIAGNOSIS — O4292 Full-term premature rupture of membranes, unspecified as to length of time between rupture and onset of labor: Principal | ICD-10-CM | POA: Diagnosis present

## 2021-04-23 DIAGNOSIS — O321XX0 Maternal care for breech presentation, not applicable or unspecified: Secondary | ICD-10-CM | POA: Diagnosis present

## 2021-04-23 DIAGNOSIS — Q513 Bicornate uterus: Secondary | ICD-10-CM

## 2021-04-23 LAB — CBC
HCT: 32.8 % — ABNORMAL LOW (ref 36.0–46.0)
Hemoglobin: 11.3 g/dL — ABNORMAL LOW (ref 12.0–15.0)
MCH: 31 pg (ref 26.0–34.0)
MCHC: 34.5 g/dL (ref 30.0–36.0)
MCV: 90.1 fL (ref 80.0–100.0)
Platelets: 182 10*3/uL (ref 150–400)
RBC: 3.64 MIL/uL — ABNORMAL LOW (ref 3.87–5.11)
RDW: 14.8 % (ref 11.5–15.5)
WBC: 14.5 10*3/uL — ABNORMAL HIGH (ref 4.0–10.5)
nRBC: 0 % (ref 0.0–0.2)

## 2021-04-23 LAB — TYPE AND SCREEN
ABO/RH(D): A POS
Antibody Screen: NEGATIVE

## 2021-04-23 LAB — RESP PANEL BY RT-PCR (FLU A&B, COVID) ARPGX2
Influenza A by PCR: NEGATIVE
Influenza B by PCR: NEGATIVE
SARS Coronavirus 2 by RT PCR: NEGATIVE

## 2021-04-23 LAB — RPR: RPR Ser Ql: NONREACTIVE

## 2021-04-23 SURGERY — Surgical Case
Anesthesia: Spinal

## 2021-04-23 MED ORDER — MEASLES, MUMPS & RUBELLA VAC IJ SOLR: 0.5000 mL | Freq: Once | INTRAMUSCULAR | Status: DC

## 2021-04-23 MED ORDER — ACETAMINOPHEN 325 MG PO TABS
650.0000 mg | ORAL_TABLET | ORAL | Status: DC | PRN
  Administered 2021-04-23 – 2021-04-24 (×2): 650 mg via ORAL
  Filled 2021-04-23 (×2): qty 2

## 2021-04-23 MED ORDER — DEXAMETHASONE SODIUM PHOSPHATE 4 MG/ML IJ SOLN
INTRAMUSCULAR | Status: AC
  Filled 2021-04-23: qty 1

## 2021-04-23 MED ORDER — SENNOSIDES-DOCUSATE SODIUM 8.6-50 MG PO TABS
2.0000 | ORAL_TABLET | ORAL | Status: DC
  Administered 2021-04-24 – 2021-04-25 (×2): 2 via ORAL
  Filled 2021-04-23 (×2): qty 2

## 2021-04-23 MED ORDER — LACTATED RINGERS IV SOLN: INTRAVENOUS | Status: DC

## 2021-04-23 MED ORDER — SODIUM CHLORIDE 0.9 % IV SOLN
INTRAVENOUS | Status: DC | PRN
  Administered 2021-04-23: 2 g via INTRAVENOUS

## 2021-04-23 MED ORDER — OXYTOCIN-SODIUM CHLORIDE 30-0.9 UT/500ML-% IV SOLN
INTRAVENOUS | Status: AC
  Filled 2021-04-23: qty 500

## 2021-04-23 MED ORDER — WITCH HAZEL-GLYCERIN EX PADS: 1.0000 "application " | MEDICATED_PAD | CUTANEOUS | Status: DC | PRN

## 2021-04-23 MED ORDER — SIMETHICONE 80 MG PO CHEW
80.0000 mg | CHEWABLE_TABLET | Freq: Three times a day (TID) | ORAL | Status: DC
  Administered 2021-04-23 – 2021-04-25 (×4): 80 mg via ORAL
  Filled 2021-04-23 (×5): qty 1

## 2021-04-23 MED ORDER — SIMETHICONE 80 MG PO CHEW: 80.0000 mg | CHEWABLE_TABLET | ORAL | Status: DC | PRN

## 2021-04-23 MED ORDER — ONDANSETRON HCL 4 MG/2ML IJ SOLN
INTRAMUSCULAR | Status: AC
  Filled 2021-04-23: qty 2

## 2021-04-23 MED ORDER — DEXAMETHASONE SODIUM PHOSPHATE 10 MG/ML IJ SOLN
INTRAMUSCULAR | Status: DC | PRN
  Administered 2021-04-23: 4 mg via INTRAVENOUS

## 2021-04-23 MED ORDER — SODIUM CHLORIDE 0.9 % IV SOLN
INTRAVENOUS | Status: DC | PRN
  Administered 2021-04-23: 500 mg via INTRAVENOUS

## 2021-04-23 MED ORDER — FENTANYL CITRATE (PF) 100 MCG/2ML IJ SOLN
INTRAMUSCULAR | Status: DC | PRN
  Administered 2021-04-23: 15 ug via INTRATHECAL

## 2021-04-23 MED ORDER — PRENATAL MULTIVITAMIN CH
1.0000 | ORAL_TABLET | Freq: Every day | ORAL | Status: DC
  Administered 2021-04-24 – 2021-04-25 (×2): 1 via ORAL
  Filled 2021-04-23 (×3): qty 1

## 2021-04-23 MED ORDER — MENTHOL 3 MG MT LOZG: 1.0000 | LOZENGE | OROMUCOSAL | Status: DC | PRN

## 2021-04-23 MED ORDER — FAMOTIDINE IN NACL 20-0.9 MG/50ML-% IV SOLN
20.0000 mg | Freq: Once | INTRAVENOUS | Status: DC
  Filled 2021-04-23: qty 50

## 2021-04-23 MED ORDER — DIBUCAINE (PERIANAL) 1 % EX OINT: 1.0000 "application " | TOPICAL_OINTMENT | CUTANEOUS | Status: DC | PRN

## 2021-04-23 MED ORDER — ONDANSETRON HCL 4 MG/2ML IJ SOLN
INTRAMUSCULAR | Status: DC | PRN
  Administered 2021-04-23: 4 mg via INTRAVENOUS

## 2021-04-23 MED ORDER — OXYCODONE-ACETAMINOPHEN 5-325 MG PO TABS
1.0000 | ORAL_TABLET | ORAL | Status: DC | PRN
  Administered 2021-04-24 – 2021-04-25 (×6): 1 via ORAL
  Filled 2021-04-23 (×7): qty 1

## 2021-04-23 MED ORDER — PHENYLEPHRINE HCL-NACL 20-0.9 MG/250ML-% IV SOLN
INTRAVENOUS | Status: DC | PRN
  Administered 2021-04-23: 60 ug/min via INTRAVENOUS

## 2021-04-23 MED ORDER — SOD CITRATE-CITRIC ACID 500-334 MG/5ML PO SOLN
30.0000 mL | Freq: Once | ORAL | Status: AC
  Administered 2021-04-23: 30 mL via ORAL
  Filled 2021-04-23: qty 30

## 2021-04-23 MED ORDER — PHENYLEPHRINE HCL-NACL 20-0.9 MG/250ML-% IV SOLN
INTRAVENOUS | Status: AC
  Filled 2021-04-23: qty 250

## 2021-04-23 MED ORDER — MORPHINE SULFATE (PF) 0.5 MG/ML IJ SOLN
INTRAMUSCULAR | Status: AC
  Filled 2021-04-23: qty 10

## 2021-04-23 MED ORDER — DIPHENHYDRAMINE HCL 25 MG PO CAPS: 25.0000 mg | ORAL_CAPSULE | Freq: Four times a day (QID) | ORAL | Status: DC | PRN

## 2021-04-23 MED ORDER — MORPHINE SULFATE (PF) 0.5 MG/ML IJ SOLN
INTRAMUSCULAR | Status: DC | PRN
  Administered 2021-04-23: 150 ug via INTRATHECAL

## 2021-04-23 MED ORDER — FENTANYL CITRATE (PF) 100 MCG/2ML IJ SOLN
INTRAMUSCULAR | Status: AC
  Filled 2021-04-23: qty 2

## 2021-04-23 MED ORDER — OXYTOCIN-SODIUM CHLORIDE 30-0.9 UT/500ML-% IV SOLN
2.5000 [IU]/h | INTRAVENOUS | Status: AC
  Administered 2021-04-23: 2.5 [IU]/h via INTRAVENOUS

## 2021-04-23 MED ORDER — OXYTOCIN-SODIUM CHLORIDE 30-0.9 UT/500ML-% IV SOLN
INTRAVENOUS | Status: DC | PRN
  Administered 2021-04-23: 200 mL via INTRAVENOUS

## 2021-04-23 MED ORDER — LACTATED RINGERS IV BOLUS
1000.0000 mL | Freq: Once | INTRAVENOUS | Status: AC
  Administered 2021-04-23: 1000 mL via INTRAVENOUS

## 2021-04-23 MED ORDER — IBUPROFEN 600 MG PO TABS
600.0000 mg | ORAL_TABLET | Freq: Four times a day (QID) | ORAL | Status: DC
  Administered 2021-04-23 – 2021-04-25 (×9): 600 mg via ORAL
  Filled 2021-04-23 (×10): qty 1

## 2021-04-23 MED ORDER — TETANUS-DIPHTH-ACELL PERTUSSIS 5-2.5-18.5 LF-MCG/0.5 IM SUSY: 0.5000 mL | PREFILLED_SYRINGE | Freq: Once | INTRAMUSCULAR | Status: DC

## 2021-04-23 MED ORDER — COCONUT OIL OIL: 1.0000 "application " | TOPICAL_OIL | Status: DC | PRN

## 2021-04-23 MED ORDER — ZOLPIDEM TARTRATE 5 MG PO TABS: 5.0000 mg | ORAL_TABLET | Freq: Every evening | ORAL | Status: DC | PRN

## 2021-04-23 MED ORDER — BUPIVACAINE IN DEXTROSE 0.75-8.25 % IT SOLN
INTRATHECAL | Status: DC | PRN
  Administered 2021-04-23: 1.6 mL via INTRATHECAL

## 2021-04-23 SURGICAL SUPPLY — 34 items
BENZOIN TINCTURE PRP APPL 2/3 (GAUZE/BANDAGES/DRESSINGS) ×2 IMPLANT
CHLORAPREP W/TINT 26ML (MISCELLANEOUS) ×3 IMPLANT
CLAMP CORD UMBIL (MISCELLANEOUS) ×3 IMPLANT
CLOSURE STERI STRIP 1/2 X4 (GAUZE/BANDAGES/DRESSINGS) ×2 IMPLANT
CLOSURE WOUND 1/2 X4 (GAUZE/BANDAGES/DRESSINGS)
CLOTH BEACON ORANGE TIMEOUT ST (SAFETY) ×3 IMPLANT
DRSG OPSITE POSTOP 4X10 (GAUZE/BANDAGES/DRESSINGS) ×3 IMPLANT
ELECT REM PT RETURN 9FT ADLT (ELECTROSURGICAL) ×3
ELECTRODE REM PT RTRN 9FT ADLT (ELECTROSURGICAL) ×1 IMPLANT
EXTRACTOR VACUUM M CUP 4 TUBE (SUCTIONS) IMPLANT
EXTRACTOR VACUUM M CUP 4' TUBE (SUCTIONS)
GLOVE BIOGEL PI IND STRL 7.0 (GLOVE) ×2 IMPLANT
GLOVE BIOGEL PI IND STRL 7.5 (GLOVE) ×1 IMPLANT
GLOVE BIOGEL PI INDICATOR 7.0 (GLOVE) ×4
GLOVE BIOGEL PI INDICATOR 7.5 (GLOVE) ×2
GLOVE ECLIPSE 7.0 STRL STRAW (GLOVE) ×3 IMPLANT
GOWN STRL REUS W/TWL LRG LVL3 (GOWN DISPOSABLE) ×6 IMPLANT
KIT ABG SYR 3ML LUER SLIP (SYRINGE) IMPLANT
NDL HYPO 25X5/8 SAFETYGLIDE (NEEDLE) IMPLANT
NEEDLE HYPO 25X5/8 SAFETYGLIDE (NEEDLE) IMPLANT
NS IRRIG 1000ML POUR BTL (IV SOLUTION) ×3 IMPLANT
PACK C SECTION WH (CUSTOM PROCEDURE TRAY) ×3 IMPLANT
PAD OB MATERNITY 4.3X12.25 (PERSONAL CARE ITEMS) ×3 IMPLANT
PENCIL SMOKE EVAC W/HOLSTER (ELECTROSURGICAL) IMPLANT
STRIP CLOSURE SKIN 1/2X4 (GAUZE/BANDAGES/DRESSINGS) IMPLANT
SUT PDS AB 0 CTX 60 (SUTURE) ×6 IMPLANT
SUT PLAIN 0 NONE (SUTURE) IMPLANT
SUT VIC AB 0 CT1 36 (SUTURE) IMPLANT
SUT VIC AB 0 CTX 36 (SUTURE) ×8
SUT VIC AB 0 CTX36XBRD ANBCTRL (SUTURE) ×4 IMPLANT
SUT VIC AB 4-0 KS 27 (SUTURE) ×3 IMPLANT
TOWEL OR 17X24 6PK STRL BLUE (TOWEL DISPOSABLE) ×3 IMPLANT
TRAY FOLEY W/BAG SLVR 14FR LF (SET/KITS/TRAYS/PACK) ×3 IMPLANT
WATER STERILE IRR 1000ML POUR (IV SOLUTION) ×5 IMPLANT

## 2021-04-23 NOTE — MAU Note (Addendum)
Adalynd Donahoe is a 36 y.o. at [redacted]w[redacted]d here in MAU reporting: pt here via EMS. Water broke at 0100 started having heavy bleeding on EMS truck. Gerrit Heck, CNM at bedside. Pt passing clots and is grossly ruptured. CNM unable to complete SVE due to significant VB. Dr. Para March and Dr. Rana Snare called. Pt states she has had a previous cesarean section and that this baby is breeched presentation. ? ?0210 Dr. Para March at bedside. Bedside ultrasound performed to confirm breeched presentation. ? ?79 Dr. Stephannie Peters at bedside.  ? ?67 Dr. Rana Snare at bedside. Decision made for STAT cesarean for placental abruption. SVE closed ? ?0225 bedside ultrasound used by Dr. Rana Snare to find FHR.  ? ?0230 informed consent signed by Dr. Rana Snare, Dr. Stephannie Peters, and patient.  ? ?0240 pt transferred to OR.  ? ? ?

## 2021-04-23 NOTE — Transfer of Care (Signed)
Immediate Anesthesia Transfer of Care Note ? ?Patient: Brandy Osborne ? ?Procedure(s) Performed: REPEAT CESAREAN SECTION EDC: 05-18-21  ALLERG: NKDA  PREVIOUS X 1 ? ?Patient Location: PACU ? ?Anesthesia Type:Spinal ? ?Level of Consciousness: awake ? ?Airway & Oxygen Therapy: Patient Spontanous Breathing ? ?Post-op Assessment: Report given to RN and Post -op Vital signs reviewed and stable ? ?Post vital signs: Reviewed and stable ? ?Last Vitals:  ?Vitals Value Taken Time  ?BP 104/81 04/23/21 0400  ?Temp    ?Pulse 105 04/23/21 0402  ?Resp 22 04/23/21 0402  ?SpO2 100 % 04/23/21 0402  ?Vitals shown include unvalidated device data. ? ?Last Pain: There were no vitals filed for this visit.   ? ?  ? ?Complications: No notable events documented. ?

## 2021-04-23 NOTE — MAU Note (Signed)
Dr. Lowe at bedside.

## 2021-04-23 NOTE — Anesthesia Procedure Notes (Signed)
Spinal ? ?Patient location during procedure: OR ?Start time: 04/23/2021 2:44 AM ?End time: 04/23/2021 2:47 AM ?Reason for block: surgical anesthesia ?Staffing ?Performed: anesthesiologist  ?Anesthesiologist: Kaylyn Layer, MD ?Preanesthetic Checklist ?Completed: patient identified, IV checked, risks and benefits discussed, monitors and equipment checked, pre-op evaluation and timeout performed ?Spinal Block ?Patient position: sitting ?Prep: DuraPrep and site prepped and draped ?Patient monitoring: heart rate, continuous pulse ox and blood pressure ?Approach: midline ?Location: L3-4 ?Injection technique: single-shot ?Needle ?Needle type: Pencan  ?Needle gauge: 24 G ?Needle length: 10 cm ?Assessment ?Sensory level: T4 ?Events: CSF return ?Additional Notes ?Risks, benefits, and alternative discussed. Patient gave consent to procedure. Prepped and draped in sitting position. Clear CSF obtained after one needle pass. Positive terminal aspiration. No pain or paraesthesias with injection. Patient tolerated procedure well. Vital signs stable. Amalia Greenhouse, MD ? ? ? ? ?

## 2021-04-23 NOTE — Anesthesia Preprocedure Evaluation (Signed)
Anesthesia Evaluation  ?Patient identified by MRN, date of birth, ID band ?Patient awake ? ? ? ?Reviewed: ?Allergy & Precautions, Patient's Chart, lab work & pertinent test results ? ?History of Anesthesia Complications ?Negative for: history of anesthetic complications ? ?Airway ?Mallampati: II ? ?TM Distance: >3 FB ?Neck ROM: Full ? ? ? Dental ?no notable dental hx. ? ?  ?Pulmonary ?neg pulmonary ROS,  ?  ?Pulmonary exam normal ? ? ? ? ? ? ? Cardiovascular ?negative cardio ROS ?Normal cardiovascular exam ? ? ?  ?Neuro/Psych ?negative neurological ROS ? negative psych ROS  ? GI/Hepatic ?negative GI ROS, Neg liver ROS,   ?Endo/Other  ?diabetes, Gestational ? Renal/GU ?negative Renal ROS  ?negative genitourinary ?  ?Musculoskeletal ?negative musculoskeletal ROS ?(+)  ? Abdominal ?  ?Peds ? Hematology ?negative hematology ROS ?(+)   ?Anesthesia Other Findings ?Day of surgery medications reviewed with patient. ? Reproductive/Obstetrics ?(+) Pregnancy ? ?  ? ? ? ? ? ? ? ? ? ? ? ? ? ?  ?  ? ? ? ? ? ? ? ? ?Anesthesia Physical ?Anesthesia Plan ? ?ASA: 3 and emergent ? ?Anesthesia Plan: Spinal  ? ?Post-op Pain Management:   ? ?Induction:  ? ?PONV Risk Score and Plan: 4 or greater and Treatment may vary due to age or medical condition, Ondansetron and Dexamethasone ? ?Airway Management Planned: Natural Airway ? ?Additional Equipment: None ? ?Intra-op Plan:  ? ?Post-operative Plan:  ? ?Informed Consent: I have reviewed the patients History and Physical, chart, labs and discussed the procedure including the risks, benefits and alternatives for the proposed anesthesia with the patient or authorized representative who has indicated his/her understanding and acceptance.  ? ? ? ? ? ?Plan Discussed with: CRNA ? ?Anesthesia Plan Comments: (Brought by EMS for heavy vaginal bleeding, suspected placental abruption. Patient and fetus stable in MAU. Plan for spinal anesthesia discussed with patient and  surgeon. Stephannie Peters, MD)  ? ? ? ? ? ? ?Anesthesia Quick Evaluation ? ?

## 2021-04-23 NOTE — H&P (Signed)
Brandy Osborne is a 36 y.o. female presenting for vaginal bleeding, PROM, and labor sxs.  Previous c/s and baby is transverse presentation and the product of fertility treatment.  GBS unknown results not back ? ?OB History   ? ? Gravida  ?2  ? Para  ?1  ? Term  ?   ? Preterm  ?1  ? AB  ?   ? Living  ?1  ?  ? ? SAB  ?   ? IAB  ?   ? Ectopic  ?   ? Multiple  ?0  ? Live Births  ?1  ?   ?  ?  ? ?Past Medical History:  ?Diagnosis Date  ? GDM (gestational diabetes mellitus)   ? Medical history non-contributory   ? ?Past Surgical History:  ?Procedure Laterality Date  ? CESAREAN SECTION N/A 01/26/2015  ? Procedure: CESAREAN SECTION;  Surgeon: Jaymes Graff, MD;  Location: [facility] ORS;  Service: Obstetrics;  Laterality: N/A;  ? NO PAST SURGERIES    ? UTERINE SEPTUM RESECTION    ? ?Family History: family history is not on file. ?Social History:  reports that she has never smoked. She has never used smokeless tobacco. She reports that she does not drink alcohol and does not use drugs. ? ? ?  ?Maternal Diabetes: No ?Genetic Screening: Normal ?Maternal Ultrasounds/Referrals: Normal ?Fetal Ultrasounds or other Referrals:  None ?Maternal Substance Abuse:  No ?Significant Maternal Medications:  None ?Significant Maternal Lab Results:  None ?Other Comments:  None ? ?Review of Systems ?History ?Station:  (No presenting part appreciated) ?Exam by:: J.Emly, CNM ?Last menstrual period 08/03/2020, unknown if currently breastfeeding. ?Exam ?Physical Exam Vitals and nursing note reviewed. Exam conducted with a chaperone present.  ?Constitutional:   ?   Appearance: Normal appearance.  ?HENT:  ?   Head: Normocephalic.  ?Eyes:  ?   Pupils: Pupils are equal, round, and reactive to light.  ?Cardiovascular:  ?   Rate and Rhythm: Normal rate and regular rhythm.  ?   Pulses: Normal pulses.  ?Abdominal:  ?   General: Abdomen is Gravid, nontender ?Neurological:  ?   Mental Status: She is alert.  ?Prenatal labs: ?Cx Closed with presenting part out of the  pelvis.  Moderate clots and fluid in the vagina ? ?ABO, Rh: --/--/PENDING (03/10 0200) ?Antibody: PENDING (03/10 0200) ?Rubella:   ?RPR:    ?HBsAg:    ?HIV:    ?GBS:    ? ?Assessment/Plan: ?IUP at 37 4/7 ?PROM ?Vaginal bleeding and early labor ?Transverse presentation, previous cesarean section for repeat ?Risks and benefits of C/S were discussed.  All questions were answered and informed consent was obtained.  Plan to proceed with low segment transverse Cesarean Section.  ? ?Turner Daniels ?04/23/2021, 2:31 AM ? ? ? ? ?

## 2021-04-23 NOTE — Discharge Summary (Signed)
? ?  Postpartum Discharge Summary ? ?Date of Service updated 04/25/21 ? ?   ?Patient Name: Brandy Osborne ?DOB: December 02, 1985 ?MRN: [id number] ? ?Date of admission: 04/23/2021 ?Delivery date:04/23/2021  ?Delivering provider: LOWE, DAVID  ?Date of discharge: 04/25/2021 ? ?Admitting diagnosis: Cesarean delivery delivered [O82] ?Intrauterine pregnancy: [redacted]w[redacted]d    ?Secondary diagnosis:  Principal Problem: ?  Cesarean delivery delivered ? ?Additional problems: Placental abruption, bicornuate uterus    ?Discharge diagnosis: Term Pregnancy Delivered                                              ?Post partum procedures: None ?Augmentation: N/A ?Complications: Placental Abruption ? ?Hospital course: Onset of Labor With Unplanned C/S   ?36y.o. yo G2P1102 at 349w4das admitted in Latent Labor on 04/23/2021. Patient had a labor course significant for presenting with heavy vaginal bleeding, transverse presentation, and a bicornuate uterus. The patient went for cesarean section due to  placental abruption and prior CS . Delivery details as follows: ?Membrane Rupture Time/Date: 1:00 AM ,04/23/2021   ?Delivery Method:C-Section, Low Transverse  ?Details of operation can be found in separate operative note. Patient had an uncomplicated postpartum course.  She is ambulating, denies dizziness, tolerating a regular diet, passing flatus, and urinating well.  Patient is discharged home in stable condition 04/25/21. ? ?Newborn Data: ?Birth date:04/23/2021  ?Birth time:3:07 AM  ?Gender:Female  ?Living status:Living  ?Apgars:9 ,9  ?Weight:3180 g  ? ?Magnesium Sulfate received: No ?BMZ received: No ?Rhophylac:N/A ?MMR:N/A ?T-DaP:Given prenatally ?Flu: N/A ?Transfusion:No ? ?Physical exam  ?Vitals:  ? 04/24/21 0523 04/24/21 1443 04/24/21 1940 04/25/21 0527  ?BP: 93/63 100/63 (!) 86/63 (!) 98/59  ?Pulse: 88 84 89 90  ?Resp: 18 16  16   ?Temp: 97.9 ?F (36.6 ?C) 98.2 ?F (36.8 ?C) 98.1 ?F (36.7 ?C) (!) 97.5 ?F (36.4 ?C)  ?TempSrc: Oral Oral Oral Oral  ?SpO2:   99%    ? ?General: alert ?Lochia: appropriate ?Uterine Fundus: firm ?Incision: Healing well with no significant drainage, No significant erythema, Dressing is clean, dry, and intact ?DVT Evaluation: No evidence of DVT seen on physical exam. ?Labs: ?Lab Results  ?Component Value Date  ? WBC 11.6 (H) 04/24/2021  ? HGB 8.5 (L) 04/24/2021  ? HCT 24.6 (L) 04/24/2021  ? MCV 89.8 04/24/2021  ? PLT 160 04/24/2021  ? ?CMP Latest Ref Rng & Units 11/11/2020  ?Glucose 70 - 99 mg/dL 102(H)  ?BUN 6 - 20 mg/dL 7  ?Creatinine 0.44 - 1.00 mg/dL 0.46  ?Sodium 135 - 145 mmol/L 134(L)  ?Potassium 3.5 - 5.1 mmol/L 3.3(L)  ?Chloride 98 - 111 mmol/L 104  ?CO2 22 - 32 mmol/L 24  ?Calcium 8.9 - 10.3 mg/dL 8.8(L)  ?Total Protein 6.5 - 8.1 g/dL 7.5  ?Total Bilirubin 0.3 - 1.2 mg/dL 0.1(L)  ?Alkaline Phos 38 - 126 U/L 44  ?AST 15 - 41 U/L 17  ?ALT 0 - 44 U/L 14  ? ?Edinburgh Score: ?Edinburgh Postnatal Depression Scale Screening Tool 04/23/2021  ?I have been able to laugh and see the funny side of things. (No Data)  ? ? ? ? ?After visit meds:  ?Allergies as of 04/25/2021   ?No Known Allergies ?  ? ?  ?Medication List  ?  ? ?STOP taking these medications   ? ?ferrous sulfate 325 (65 FE) MG tablet ?Replaced by: ferrous sulfate 325 (65  FE) MG EC tablet ?  ?FOLIC ACID PO ?  ?norethindrone 0.35 MG tablet ?Commonly known as: Ortho Micronor ?  ?oxyCODONE-acetaminophen 5-325 MG tablet ?Commonly known as: PERCOCET/ROXICET ?  ?senna-docusate 8.6-50 MG tablet ?Commonly known as: Senokot-S ?  ? ?  ? ?TAKE these medications   ? ?docusate sodium 100 MG capsule ?Commonly known as: Colace ?Take 1 capsule (100 mg total) by mouth 2 (two) times daily. ?  ?ferrous sulfate 325 (65 FE) MG EC tablet ?Take 1 tablet (325 mg total) by mouth 2 (two) times daily. ?Replaces: ferrous sulfate 325 (65 FE) MG tablet ?  ?ibuprofen 600 MG tablet ?Commonly known as: ADVIL ?Take 1 tablet (600 mg total) by mouth every 6 (six) hours as needed. ?  ?oxyCODONE 5 MG immediate release  tablet ?Commonly known as: Oxy IR/ROXICODONE ?Take 1 tablet (5 mg total) by mouth every 4 (four) hours as needed for severe pain. ?  ? ?  ? ?Declined circ. ? ?Discharge home in stable condition ?Infant Feeding: Bottle and Breast ?Infant Disposition:home with mother ?Discharge instruction: per After Visit Summary and Postpartum booklet. ?Activity: Advance as tolerated. Pelvic rest for 6 weeks.  ?Diet: routine diet ?Anticipated Birth Control: Unsure ?Postpartum Appointment:6 weeks ?Additional Postpartum F/U:  none ?Future Appointments:No future appointments. ?Follow up Visit: ? ? ?  ? ?04/23/2021 ?Tyson Dense, MD ? ? ?

## 2021-04-23 NOTE — MAU Provider Note (Signed)
?History  ?  ? ?CSN: [id number] ? ?Arrival date and time: 04/23/21 0153 ? ? Event Date/Time  ? First Provider Initiated Contact with Patient 04/23/21 0217   ?  ? ?No chief complaint on file. ? ?Brandy Osborne is a 36 y.o. G2P0101 at [redacted]w[redacted]d who receives care at [facility].  She presents today, via EMS, for labor.  She reports SROM at 0100 with onset of vaginal bleeding while in EMS.  She denies issues during the pregnancy, but reports her infant was breech this morning.  She endorses a history of C/S with her previous delivery.  ? ? ?OB History   ? ? Gravida  ?2  ? Para  ?1  ? Term  ?   ? Preterm  ?1  ? AB  ?   ? Living  ?1  ?  ? ? SAB  ?   ? IAB  ?   ? Ectopic  ?   ? Multiple  ?0  ? Live Births  ?1  ?   ?  ?  ? ? ?Past Medical History:  ?Diagnosis Date  ? GDM (gestational diabetes mellitus)   ? Medical history non-contributory   ? ? ?Past Surgical History:  ?Procedure Laterality Date  ? CESAREAN SECTION N/A 01/26/2015  ? Procedure: CESAREAN SECTION;  Surgeon: Jaymes Graff, MD;  Location: [facility];  Service: Obstetrics;  Laterality: N/A;  ? NO PAST SURGERIES    ? UTERINE SEPTUM RESECTION    ? ? ?No family history on file. ? ?Social History  ? ?Tobacco Use  ? Smoking status: Never  ? Smokeless tobacco: Never  ?Substance Use Topics  ? Alcohol use: No  ? Drug use: No  ? ? ?Allergies: No Known Allergies ? ?Medications Prior to Admission  ?Medication Sig Dispense Refill Last Dose  ? ferrous sulfate 325 (65 FE) MG tablet Take 1 tablet (325 mg total) by mouth daily with breakfast. (Patient not taking: No sig reported) 30 tablet 2   ? FOLIC ACID PO Take 1 tablet by mouth daily.     ? ibuprofen (ADVIL,MOTRIN) 600 MG tablet Take 1 tablet (600 mg total) by mouth every 6 (six) hours as needed. (Patient not taking: No sig reported) 30 tablet 2   ? norethindrone (ORTHO MICRONOR) 0.35 MG tablet Take 1 tablet (0.35 mg total) by mouth daily. (Patient not taking: No sig reported) 1 Package 11   ? oxyCODONE-acetaminophen  (PERCOCET/ROXICET) 5-325 MG tablet Take 1 tablet by mouth every 4 (four) hours as needed (for pain scale 4-7). (Patient not taking: No sig reported) 30 tablet 0   ? senna-docusate (SENOKOT-S) 8.6-50 MG tablet Take 2 tablets by mouth at bedtime as needed for mild constipation. (Patient not taking: No sig reported) 60 tablet 1   ? ? ?Review of Systems  ?Gastrointestinal:  Positive for abdominal pain. Negative for nausea and vomiting.  ?Genitourinary:  Positive for vaginal bleeding. Negative for vaginal discharge.  ?Physical Exam  ? ?Last menstrual period 08/03/2020, unknown if currently breastfeeding. ? ?Physical Exam ?Vitals reviewed. Exam conducted with a chaperone present.  ?Constitutional:   ?   General: She is in acute distress.  ?   Appearance: Normal appearance.  ?HENT:  ?   Head: Normocephalic and atraumatic.  ?Eyes:  ?   Conjunctiva/sclera: Conjunctivae normal.  ?Cardiovascular:  ?   Rate and Rhythm: Normal rate.  ?   Heart sounds: Normal heart sounds.  ?Pulmonary:  ?   Effort: Pulmonary effort is normal.  ?  Breath sounds: Normal breath sounds.  ?Abdominal:  ?   General: Bowel sounds are normal.  ?Genitourinary: ?   Comments: Large golf ball sized clot at introitus. ?BME attempted with cervix not identified d/t many clots in vagina and patient discomfort. ?BSUS confirms transverse presentation. ? ?Musculoskeletal:     ?   General: Normal range of motion.  ?   Cervical back: Normal range of motion.  ?Skin: ?   General: Skin is warm and dry.  ?Neurological:  ?   Mental Status: She is alert and oriented to person, place, and time.  ?Psychiatric:     ?   Mood and Affect: Mood normal.     ?   Behavior: Behavior normal.  ? ?Station:  (No presenting part appreciated) ?Presentation: Transverse (Via Ultrasound) ?Exam by:: J.Jathan Balling, CNM ? ?Fetal Assessment ?148 bpm by doppler ?Toco: Not applied.  Contractions palpate strong, soft resting tone.  ? ?MAU Course  ? ?Results for orders placed or performed during the  hospital encounter of 04/23/21 (from the past 24 hour(s))  ?CBC     Status: Abnormal  ? Collection Time: 04/23/21  2:00 AM  ?Result Value Ref Range  ? WBC 14.5 (H) 4.0 - 10.5 K/uL  ? RBC 3.64 (L) 3.87 - 5.11 MIL/uL  ? Hemoglobin 11.3 (L) 12.0 - 15.0 g/dL  ? HCT 32.8 (L) 36.0 - 46.0 %  ? MCV 90.1 80.0 - 100.0 fL  ? MCH 31.0 26.0 - 34.0 pg  ? MCHC 34.5 30.0 - 36.0 g/dL  ? RDW 14.8 11.5 - 15.5 %  ? Platelets 182 150 - 400 K/uL  ? nRBC 0.0 0.0 - 0.2 %  ? ?No results found. ? ?MDM ?PE ?Admit Labs ?EFM ? ?Assessment and Plan  ?36 year old G2P0101  ?SIUP at 37.4 weeks ?Vaginal Bleeding d/t likely Abruption ?Breech Presentation ? ? ?-Exam performed and findings discussed.  ?-Dr. Para March called to bedside by provider. ?-Nurse instructed to contact Dr. Rana Snare and inform of patient arrival and likely abruption in setting of breech presentation. ?-Dr. Para March to bedside and US performed confirming transverse lie with head on maternal left.  ?-Will remain at bedside until primary provider arrives.  ? ?Cherre Robins MSN, CNM ?04/23/2021, 2:17 AM  ? ?

## 2021-04-23 NOTE — Anesthesia Postprocedure Evaluation (Signed)
Anesthesia Post Note ? ?Patient: Brandy Osborne ? ?Procedure(s) Performed: REPEAT CESAREAN SECTION EDC: 05-18-21  ALLERG: NKDA  PREVIOUS X 1 ? ?  ? ?Patient location during evaluation: PACU ?Anesthesia Type: Spinal ?Level of consciousness: awake and alert ?Pain management: pain level controlled ?Vital Signs Assessment: post-procedure vital signs reviewed and stable ?Respiratory status: spontaneous breathing, nonlabored ventilation and respiratory function stable ?Cardiovascular status: blood pressure returned to baseline ?Postop Assessment: no apparent nausea or vomiting, spinal receding, no headache and no backache ?Anesthetic complications: no ? ? ?No notable events documented. ? ?Last Vitals:  ?Vitals:  ? 04/23/21 0451 04/23/21 0515  ?BP:  (!) 117/92  ?Pulse:  96  ?Resp: (!) 23 20  ?Temp: 36.8 ?C 36.7 ?C  ?SpO2:  99%  ?  ?Last Pain:  ?Vitals:  ? 04/23/21 0515  ?TempSrc: Oral  ?PainSc: 0-No pain  ? ?Pain Goal:   ? ?  ?  ?  ?  ?  ?  ?Epidural/Spinal Function Cutaneous sensation: Normal sensation (pt reports heaviness in legs) (04/23/21 0515), Patient able to flex knees: Yes (04/23/21 0515), Patient able to lift hips off bed: Yes (04/23/21 0515), Back pain beyond tenderness at insertion site: No (04/23/21 0515), Progressively worsening motor and/or sensory loss: No (04/23/21 0515), Bowel and/or bladder incontinence post epidural: No (04/23/21 0515) ? ?[name] ? ? ? ? ?

## 2021-04-23 NOTE — Lactation Note (Signed)
This note was copied from a baby's chart. ?Lactation Consultation Note ? ?Patient Name: Brandy Osborne ?Today's Date: 04/23/2021 ?Reason for consult: Initial assessment;Early term 37-38.6wks ?Age:35 hours ? ? ?P2 mother whose infant is now 30 hours old.  This is an ETI at 37+4 weeks.  Mother breast fed her first child (now 34 years old) for 6 months.  Her current feeding preference is breast/formula.   ? ?Baby was swaddled and asleep in the bassinet when I arrived.  Mother reported no difficulties with latching, however, baby remains sleepy.  Reassured her that this is typical behavior at this age.  She is supplementing with appropriate amounts of formula; volume supplementation guidelines given.  Encouraged to call for latch assistance if needed.  Father and family members present at this time. ? ?Mother has ordered a DEBP from her insurance company. ? ? ?Maternal Data ?Has patient been taught Hand Expression?: Yes ?Does the patient have breastfeeding experience prior to this delivery?: Yes ?How long did the patient breastfeed?: 6 months ? ?Feeding ?Mother's Current Feeding Choice: Breast Milk and Formula ? ?LATCH Score ?Latch: Repeated attempts needed to sustain latch, nipple held in mouth throughout feeding, stimulation needed to elicit sucking reflex. ? ?Audible Swallowing: A few with stimulation ? ?Type of Nipple: Everted at rest and after stimulation ? ?Comfort (Breast/Nipple): Soft / non-tender ? ?Hold (Positioning): No assistance needed to correctly position infant at breast. ? ?LATCH Score: 8 ? ? ?Lactation Tools Discussed/Used ?  ? ?Interventions ?Interventions: Breast feeding basics reviewed;Education ? ?Discharge ?Pump: Personal (Has ordered her pump through insurance company) ?WIC Program: No ? ?Consult Status ?Consult Status: Follow-up ?Date: 04/24/21 ?Follow-up type: In-patient ? ? ? ?Ketura Sirek R Careena Degraffenreid ?04/23/2021, 2:55 PM ? ? ? ?

## 2021-04-23 NOTE — Op Note (Addendum)
Cesarean Section Procedure Note ? 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 ?Post-operative Diagnosis: same plus placenta abruption ? ?Surgeon: Turner Daniels  ? ?Assistants: none ? ?Anesthesia: spinal ? ?Procedure:  Low Segment Transverse cesarean section ? ?Procedure Details  ?The patient was seen in the Holding Room. The risks, benefits, complications, treatment options, and expected outcomes were discussed with the patient.  The patient concurred with the proposed plan, giving informed consent.  The site of surgery properly noted/marked.. A Time Out was held and the above information confirmed. ? ?After induction of anesthesia, the patient was draped and prepped in the usual sterile manner. A Pfannenstiel incision was made and carried down through the subcutaneous tissue to the fascia. Fascial incision was made and extended transversely. The fascia was separated from the underlying rectus tissue superiorly and inferiorly. The peritoneum was identified and entered. Peritoneal incision was extended longitudinally. The utero-vesical peritoneal reflection was incised transversely and the bladder flap was bluntly freed from the lower uterine segment. A low transverse uterine incision was made. Delivered from breech presentation after converted from transverse was a baby with Apgar scores of 9 at one minute and 9 at five minutes. After the umbilical cord was clamped and cut cord blood was obtained for evaluation. The placenta was removed intact and appeared normal. The uterine outline, tubes and ovaries appeared normal. The uterine incision was closed with running locked sutures of 0 monocryl and imbricated with 0 monocryl. Hemostasis was observed. Lavage was carried out until clear. The peritoneum was then closed with 0 monocryl and rectus muscles plicated in the midline.  After hemostasis was  assured, the fascia was then reapproximated with running sutures of 0 Vicryl. Irrigation was applied and after adequate hemostasis was assured, the skin was reapproximated with subcutaneous sutures using 4-0 monocryl. ? ?Instrument, sponge, and needle counts were correct prior the abdominal closure and at the conclusion of the case. The patient received 2 grams cefotetan and 500mg  zithromycin preoperatively. ? ?Findings: ?Viable  female ? ?Estimated Blood Loss:  300cc  ?        ?Specimens: Placenta was sent to labor and delivery ?        ?Complications:  None  ?

## 2021-04-24 LAB — CBC
HCT: 24.6 % — ABNORMAL LOW (ref 36.0–46.0)
Hemoglobin: 8.5 g/dL — ABNORMAL LOW (ref 12.0–15.0)
MCH: 31 pg (ref 26.0–34.0)
MCHC: 34.6 g/dL (ref 30.0–36.0)
MCV: 89.8 fL (ref 80.0–100.0)
Platelets: 160 10*3/uL (ref 150–400)
RBC: 2.74 MIL/uL — ABNORMAL LOW (ref 3.87–5.11)
RDW: 15 % (ref 11.5–15.5)
WBC: 11.6 10*3/uL — ABNORMAL HIGH (ref 4.0–10.5)
nRBC: 0 % (ref 0.0–0.2)

## 2021-04-24 MED ORDER — FERROUS SULFATE 325 (65 FE) MG PO TABS
325.0000 mg | ORAL_TABLET | Freq: Every day | ORAL | Status: DC
  Administered 2021-04-24 – 2021-04-25 (×2): 325 mg via ORAL
  Filled 2021-04-24 (×2): qty 1

## 2021-04-24 NOTE — Plan of Care (Signed)
?  Problem: Education: ?Goal: Knowledge of condition will improve ?Outcome: Completed/Met ?  ?

## 2021-04-24 NOTE — Progress Notes (Signed)
Subjective: ?Postpartum Day 1: Cesarean Delivery ?Patient reports tolerating PO and no problems voiding.   ? ?Objective: ?Vital signs in last 24 hours: ?Temp:  [97.9 ?F (36.6 ?C)-98.6 ?F (37 ?C)] 97.9 ?F (36.6 ?C) (03/11 9798) ?Pulse Rate:  [88-93] 88 (03/11 0523) ?Resp:  [16-18] 18 (03/11 0523) ?BP: (93-108)/(62-69) 93/63 (03/11 0523) ?SpO2:  [97 %-100 %] 100 % (03/10 2344) ? ?Physical Exam:  ?General: alert ?Lochia: appropriate ?Uterine Fundus: firm ?Incision: healing well, no significant drainage, no dehiscence ?DVT Evaluation: No evidence of DVT seen on physical exam. ? ?Recent Labs  ?  04/23/21 ?0200 04/24/21 ?0502  ?HGB 11.3* 8.5*  ?HCT 32.8* 24.6*  ? ? ?Assessment/Plan: ?Status post Cesarean section. Doing well postoperatively.  ?Continue current care. Declines circ. ?Acute blood loss anemia - asymptomatic. Continue PNV iron.  ?Anticipate d/c tomorrow.  ? ? ?Brandy Osborne ?04/24/2021, 9:08 AM ? ? ?

## 2021-04-25 MED ORDER — FERROUS SULFATE 325 (65 FE) MG PO TBEC: 325.0000 mg | DELAYED_RELEASE_TABLET | Freq: Two times a day (BID) | ORAL | 2 refills | Status: AC

## 2021-04-25 MED ORDER — OXYCODONE HCL 5 MG PO TABS
5.0000 mg | ORAL_TABLET | ORAL | 0 refills | Status: AC | PRN
Start: 2021-04-25 — End: ?

## 2021-04-25 MED ORDER — IBUPROFEN 600 MG PO TABS: 600.0000 mg | ORAL_TABLET | Freq: Four times a day (QID) | ORAL | 0 refills | Status: AC | PRN

## 2021-04-25 MED ORDER — DOCUSATE SODIUM 100 MG PO CAPS: 100.0000 mg | ORAL_CAPSULE | Freq: Two times a day (BID) | ORAL | 2 refills | Status: AC

## 2021-04-25 NOTE — Lactation Note (Signed)
This note was copied from a baby's chart. ?Lactation Consultation Note ? ?Patient Name: Brandy Osborne ?Today's Date: 04/25/2021 ?Reason for consult: Follow-up assessment;Mother's request;Early term 37-38.6wks ?Age:35 hours ? ?LC in to visit with P2 Mom of ET infant of day of discharge.  Baby at 4% weight loss.  Mom has been alternating breast and formula.  Breasts are filling.  Mom had baby on the right breast and pulling breast away from baby's nose.  Baby swaddled.  Recommended Mom support her breast with 4 fingers under and thumb above.   ? ?Baby came off the breast and Mom states she can't latch baby to her left breast.  Reviewed hand expression, milk easily expressed.  Baby placed STS in football hold on left breast.  Assisted Mom to wait for a wide open mouth and bring baby to breast quickly.  Baby latched well and vigorously sucking and swallowing.  Mom reassured that baby was doing well.  ? ?Encouraged more STS and offering the breast often with feeding cues.  Goal of 8-12 feedings per 24 hrs shared. ? ?Engorgement prevention and treatment reviewed ? ?Mom to take baby to the [facility] 3[facility].  Message sent to Soyla Dryer RN IBCLC to F/U. ? ?Feeding ?Nipple Type:  (still latched well and feeding on second breast when LC left room) ? ?LATCH Score ?Latch: Grasps breast easily, tongue down, lips flanged, rhythmical sucking. ? ?Audible Swallowing: Spontaneous and intermittent ? ?Type of Nipple: Everted at rest and after stimulation ? ?Comfort (Breast/Nipple): Soft / non-tender ? ?Hold (Positioning): Assistance needed to correctly position infant at breast and maintain latch. ? ?LATCH Score: 9 ? ? ?Lactation Tools Discussed/Used ?Tools: Pump;Flanges ?Flange Size: 24 ?Breast pump type: Double-Electric Breast Pump;Manual ?Pumped volume: 30 mL ? ?Interventions ?Interventions: Breast feeding basics reviewed;Assisted with latch;Skin to skin;Breast massage;Breast compression;Adjust position;Support  pillows;Position options;Hand pump;DEBP;Education ? ?Discharge ?Discharge Education: Outpatient recommendation;Engorgement and breast care;Warning signs for feeding baby ?Pump: Manual ? ?Consult Status ?Consult Status: Complete ?Date: 04/25/21 ?Follow-up type: Out-patient ? ? ? ?Johny Blamer E ?04/25/2021, 1:44 PM ? ? ? ?

## 2021-04-26 LAB — SURGICAL PATHOLOGY

## 2021-05-04 ENCOUNTER — Telehealth (HOSPITAL_COMMUNITY): Payer: Self-pay | Admitting: *Deleted

## 2021-05-04 NOTE — Telephone Encounter (Signed)
Attempted Hospital Discharge Follow-Up Call.  No voice mail set up.  Unable to leave a message. ?

## 2021-05-13 ENCOUNTER — Inpatient Hospital Stay (HOSPITAL_COMMUNITY): Admit: 2021-05-13 | Payer: 59 | Admitting: Obstetrics and Gynecology

## 2022-12-17 IMAGING — US US OB LIMITED
1 series · 14 of 28 positions shown · non-contrast
Comparison: None.

CLINICAL DATA: Vaginal bleeding during pregnancy

EXAM:
LIMITED OBSTETRIC ULTRASOUND

[Series 1: us ob limited · 48 acquisitions, 14 frames shown]
[im 2/48]
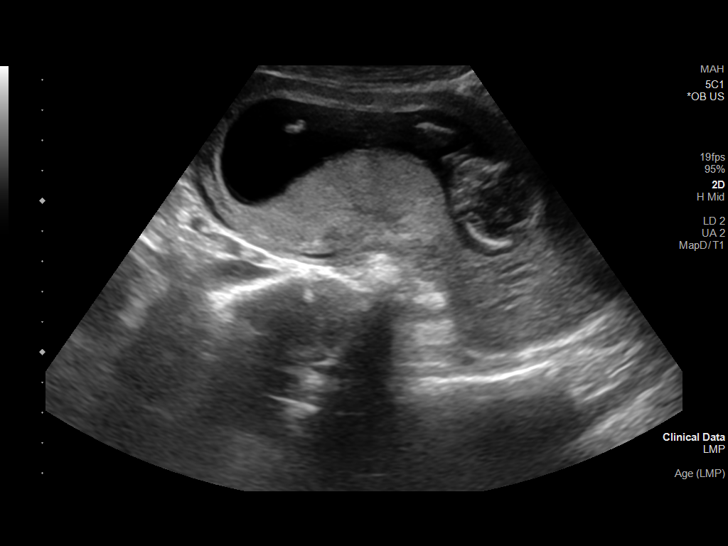
[im 6/48]
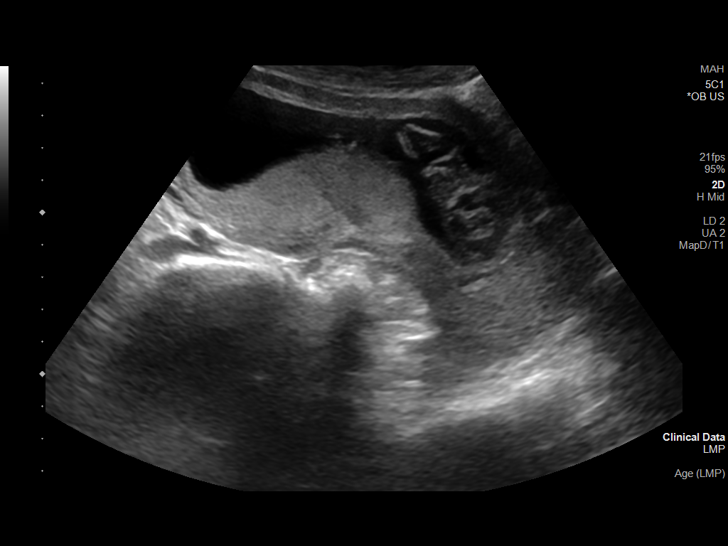
[im 9/48]
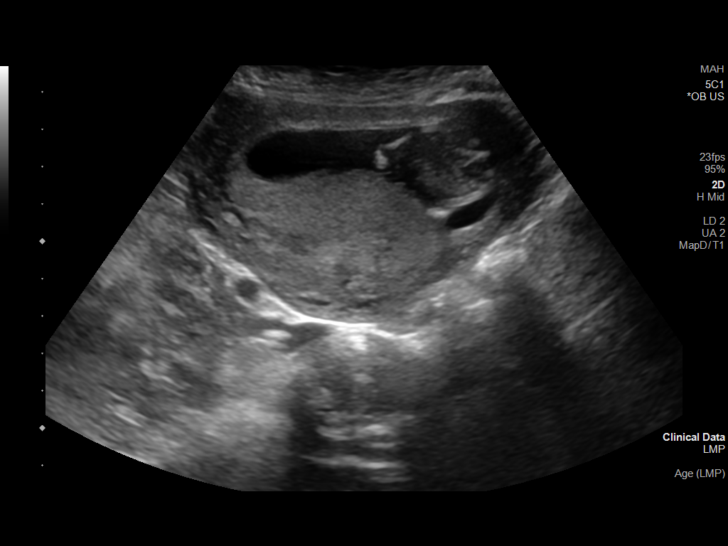
[im 13/48]
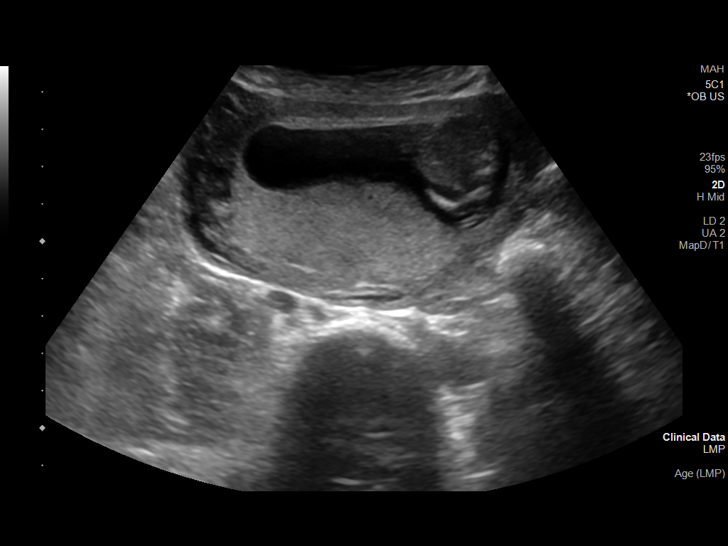
[im 16/48]
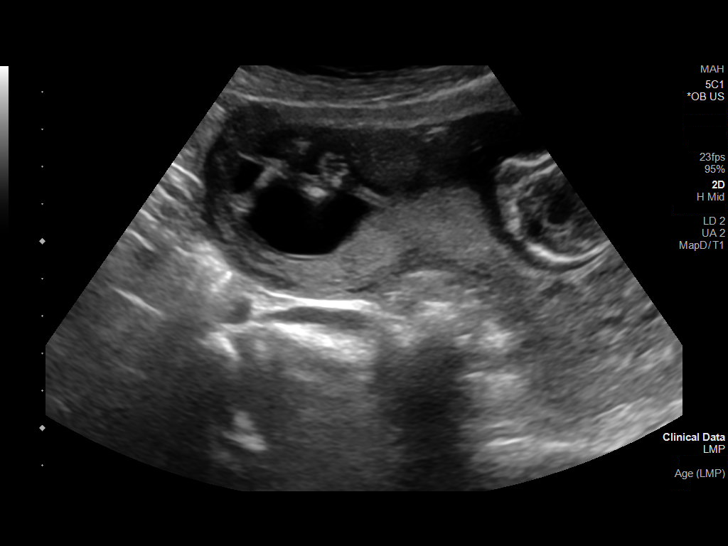
[im 20/48]
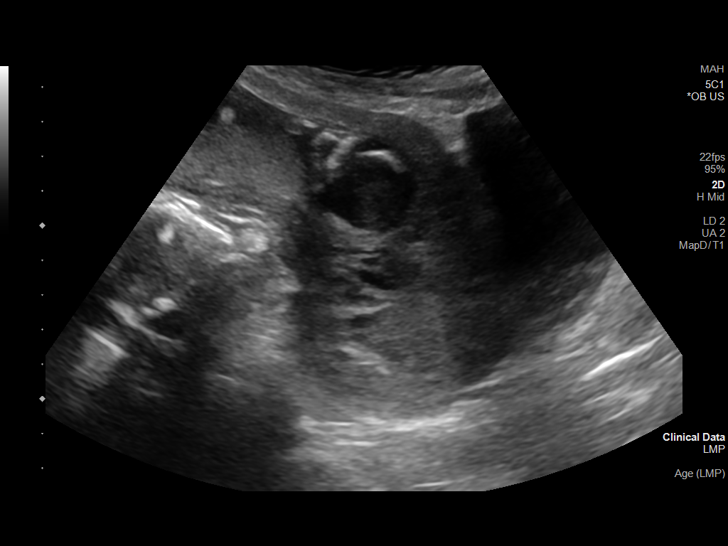
[im 23/48]
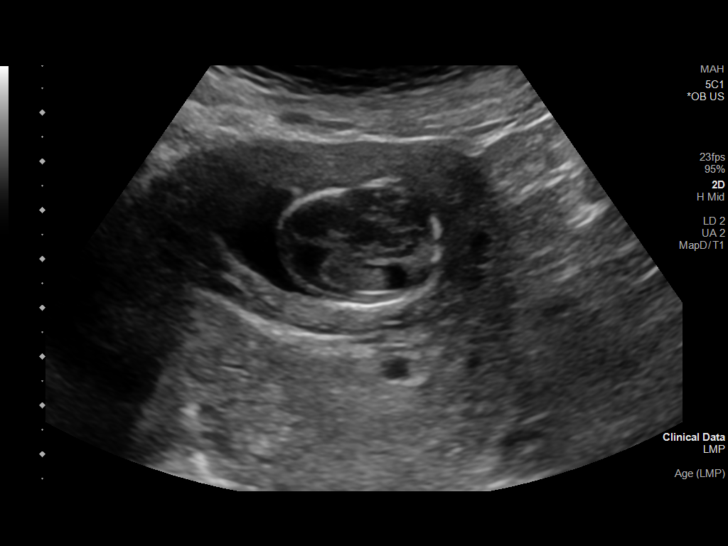
[im 27/48]
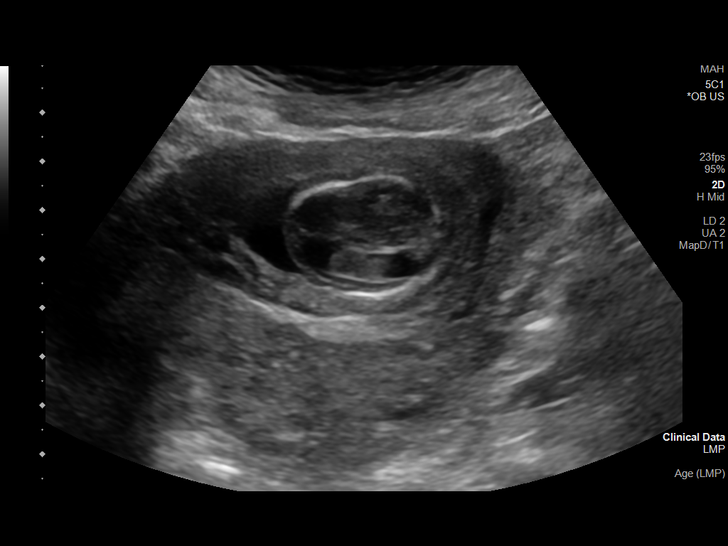
[im 30/48]
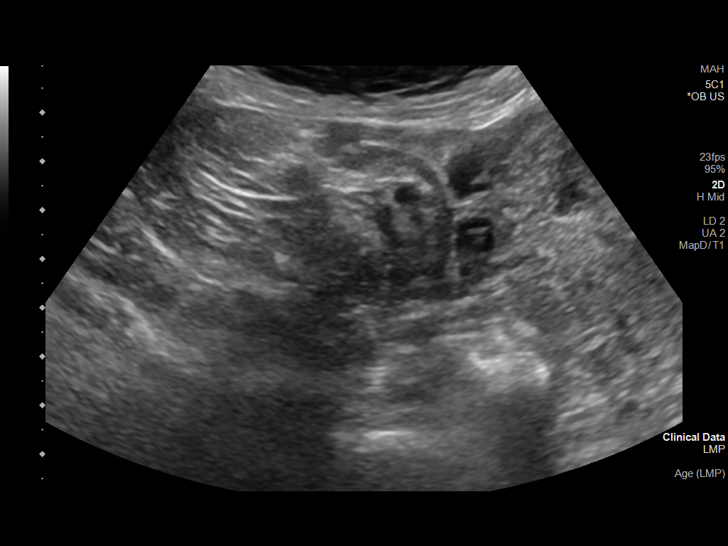
[im 34/48]
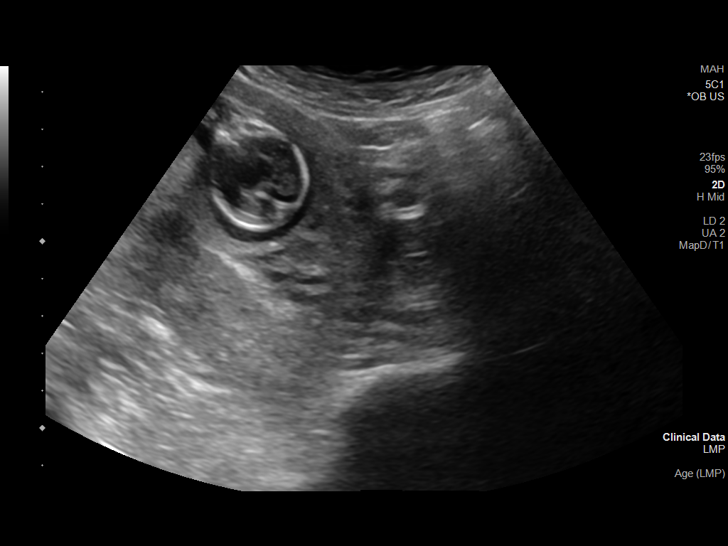
[im 37/48]
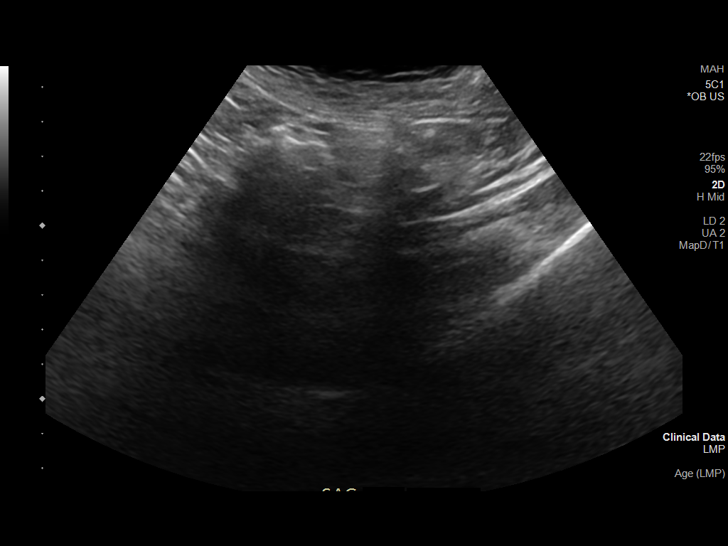
[im 41/48]
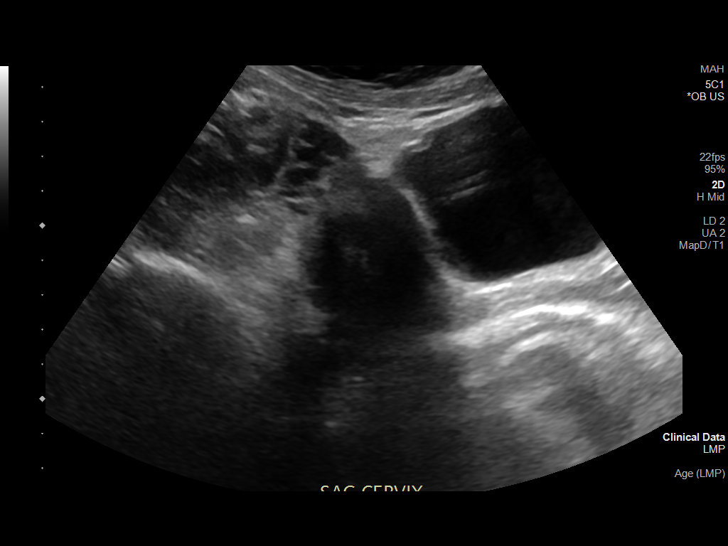
[im 44/48]
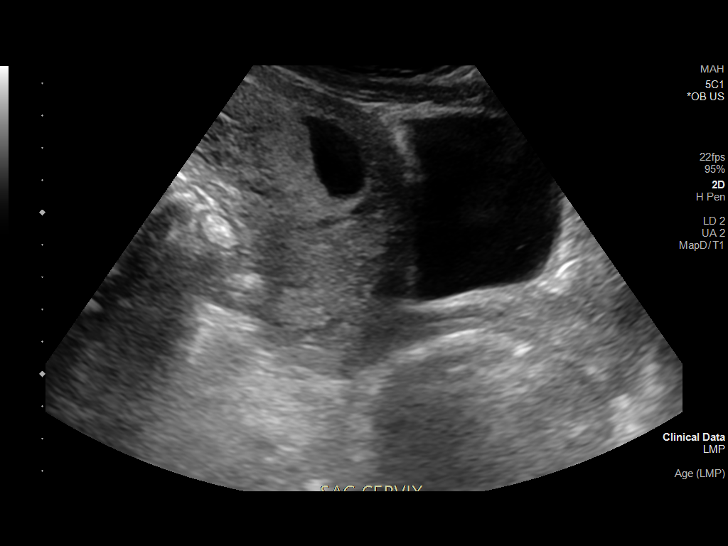
[im 48/48]
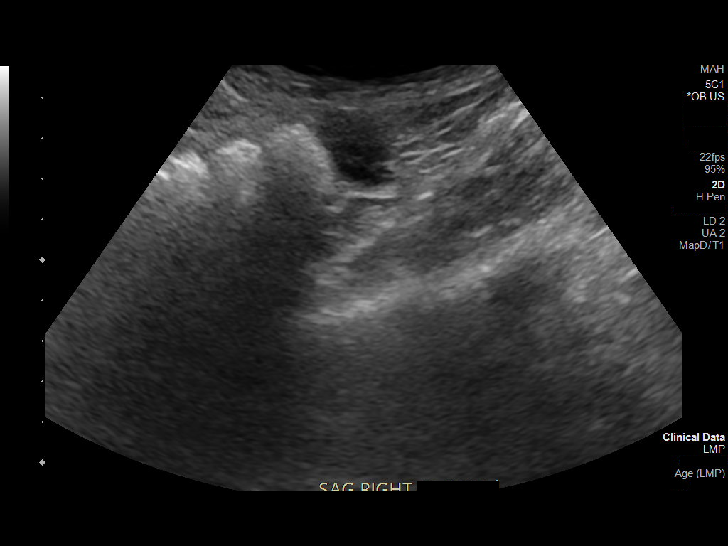

[14 of 28 positions shown; findings below may reference images not displayed]

FINDINGS: Number of Fetuses: 1

Heart Rate:  157 bpm

Movement: Yes

Presentation: Cephalic

Placental Location: Posterior

Previa: No

Amniotic Fluid (Subjective):  Normal

BPD: 2.5 cm 14 w  2 d

MATERNAL FINDINGS:

Cervix:  Appears closed.

Uterus/Adnexae: No adnexal masses or free fluid.
IMPRESSION: 1. Single live intrauterine pregnancy as above, estimated age 14
weeks and 2 days.

This exam is performed on an emergent basis and does not
comprehensively evaluate fetal size, dating, or anatomy; follow-up
complete OB US should be considered if further fetal assessment is
warranted.
